# Patient Record
Sex: Female | Born: 1990 | Race: Black or African American | Hispanic: No | Marital: Single | Smoking: Never smoker
Health system: Southern US, Community
[De-identification: ages and names within clinical notes are randomized; demographics above are authoritative.]

---
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
## 2010-02-05 ENCOUNTER — Emergency Department (HOSPITAL_COMMUNITY): Admission: EM | Admit: 2010-02-05 | Discharge: 2010-02-05 | Payer: Self-pay | Admitting: Emergency Medicine

## 2010-09-12 ENCOUNTER — Ambulatory Visit (HOSPITAL_COMMUNITY): Admission: RE | Admit: 2010-09-12 | Payer: Self-pay | Source: Home / Self Care | Admitting: Obstetrics & Gynecology

## 2010-10-02 ENCOUNTER — Ambulatory Visit (HOSPITAL_COMMUNITY)
Admission: RE | Admit: 2010-10-02 | Discharge: 2010-10-02 | Payer: Self-pay | Source: Home / Self Care | Attending: Obstetrics & Gynecology | Admitting: Obstetrics & Gynecology

## 2010-10-08 ENCOUNTER — Encounter: Payer: Self-pay | Admitting: Obstetrics & Gynecology

## 2011-03-02 ENCOUNTER — Other Ambulatory Visit: Payer: Self-pay | Admitting: Obstetrics & Gynecology

## 2011-03-02 DIAGNOSIS — O480 Post-term pregnancy: Secondary | ICD-10-CM

## 2011-03-04 ENCOUNTER — Inpatient Hospital Stay (HOSPITAL_COMMUNITY)
Admission: AD | Admit: 2011-03-04 | Discharge: 2011-03-07 | DRG: 775 | Disposition: A | Discharge status: Home / Self Care | Payer: Medicaid Other | Source: Ambulatory Visit | Attending: Obstetrics & Gynecology | Admitting: Obstetrics & Gynecology

## 2011-03-05 LAB — CBC
Hemoglobin: 13.2 g/dL (ref 12.0–15.0)
RBC: 4.75 MIL/uL (ref 3.87–5.11)
WBC: 8.8 10*3/uL (ref 4.0–10.5)

## 2011-03-05 LAB — RPR: RPR Ser Ql: NONREACTIVE

## 2011-03-06 ENCOUNTER — Ambulatory Visit (HOSPITAL_COMMUNITY): Payer: Medicaid Other | Attending: Obstetrics & Gynecology

## 2011-03-06 LAB — CBC
HCT: 31.5 % — ABNORMAL LOW (ref 36.0–46.0)
Hemoglobin: 10.5 g/dL — ABNORMAL LOW (ref 12.0–15.0)
RBC: 3.8 MIL/uL — ABNORMAL LOW (ref 3.87–5.11)
WBC: 11 10*3/uL — ABNORMAL HIGH (ref 4.0–10.5)

## 2011-03-13 NOTE — H&P (Signed)
  NAMEMONIK, LINS NO.:  [id number]  MEDICAL RECORD NO.:  [id number]  LOCATION:  9118                          FACILITY:  [facility]  PHYSICIAN:  Roseanna Rainbow, M.D.DATE OF BIRTH:  [birth year]  DATE OF ADMISSION:  03/04/2011 DATE OF DISCHARGE:                             HISTORY & PHYSICAL   CHIEF COMPLAINT:  The patient is a 20 year old gravida 1, para 0 with an estimated date of confinement of March 02, 2011, with an intrauterine pregnancy of 40 plus weeks complaining of contractions.  HISTORY OF PRESENT ILLNESS:  Please see the above.  The patient denies rupture of membranes.  ALLERGIES:  No known drug allergies.  MEDICATIONS:  Please see the medication reconciliation form.  PRENATAL LABORATORY DATA:  Chlamydia probe negative.  Urine culture and sensitivity no growth.  Pap smear negative.  GC probe negative.  A 2- hour GTT normal.  Hepatitis B surface antigen negative.  Hematocrit33.5, hemoglobin 11.2.  HIV nonreactive.  Quad screen positive, increases risk of Down's, however, the patient's final EDC was re-dated based on ultrasound dating.  Platelet count 224,000, blood type is B+, antibody screen negative, RPR nonreactive, rubella immune, sickle cell negative.  PAST GYNECOLOGICAL HISTORY:  Noncontributory.  PAST MEDICAL HISTORY:  She denies past surgical history.  She denies social history.  She works at [employer].  She is single, not currently using alcohol.  Denies any tobacco or drug use.  FAMILY HISTORY:  Remarkable for diabetes, hypertension.  PHYSICAL EXAMINATION:  VITAL SIGNS:  Stable, afebrile.  Fetal heart tracing 140s, moderate long-term variability.  Contractions every 5 minutes. PELVIC:  Sterile vaginal exam per the RN, cervix is 5 cm, dilated, 80% effaced.  ASSESSMENT:  Nullipara at term, early active labor versus late latent labor.  Category I fetal heart tracing.  PLAN:  Admission, expectant management, anticipate a vaginal  delivery.     Roseanna Rainbow, M.D.     Judee Clara  D:  03/05/2011  T:  03/05/2011  Job:  [id number]  Electronically Signed by Antionette Char M.D. on 03/13/2011 03:46:20 PM

## 2011-06-24 IMAGING — US US OB DETAIL+14 WK
1 series · 12 of 28 positions shown · non-contrast
Comparison: none

[Series 1: us ob detail +14 wk · 12 of 78 slices shown]
[im 3/78]
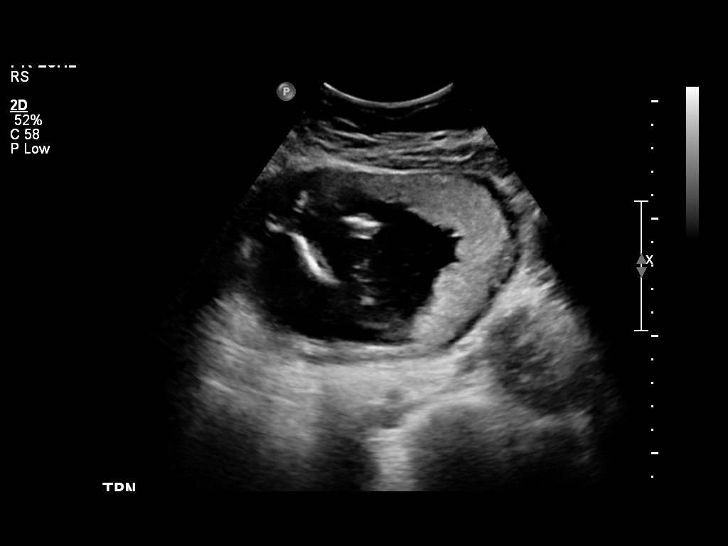
[im 9/78]
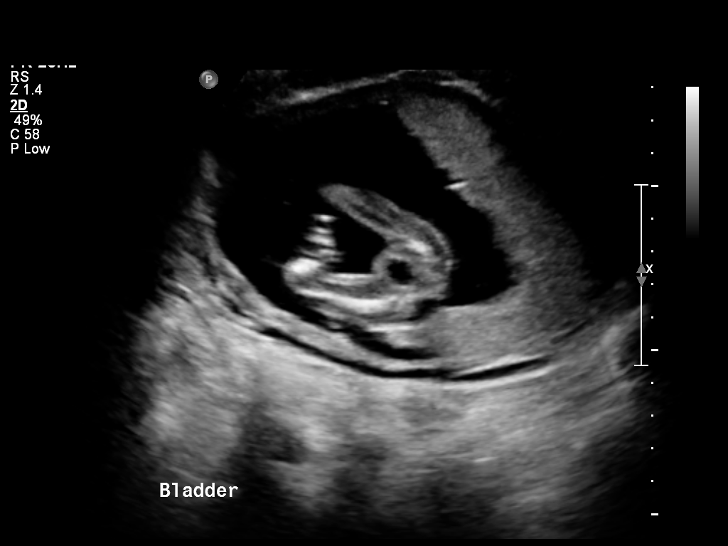
[im 15/78]
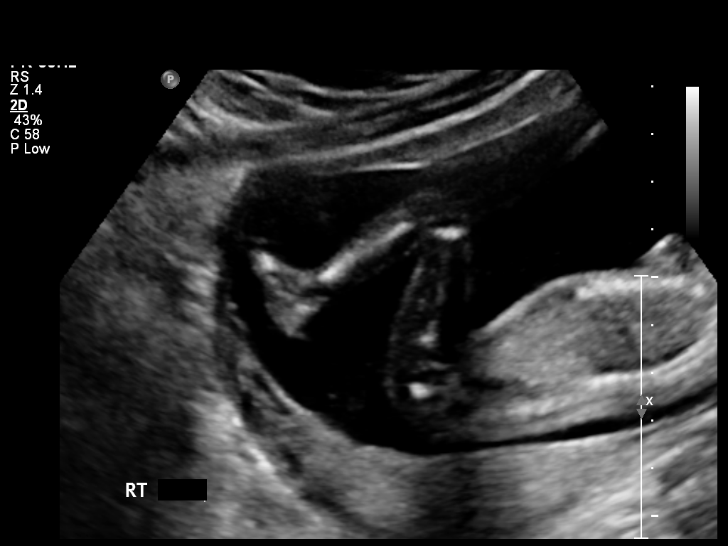
[im 23/78]
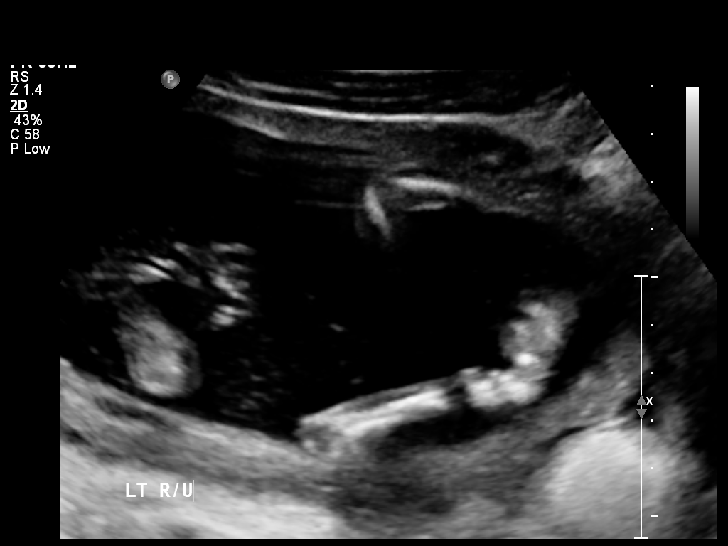
[im 29/78]
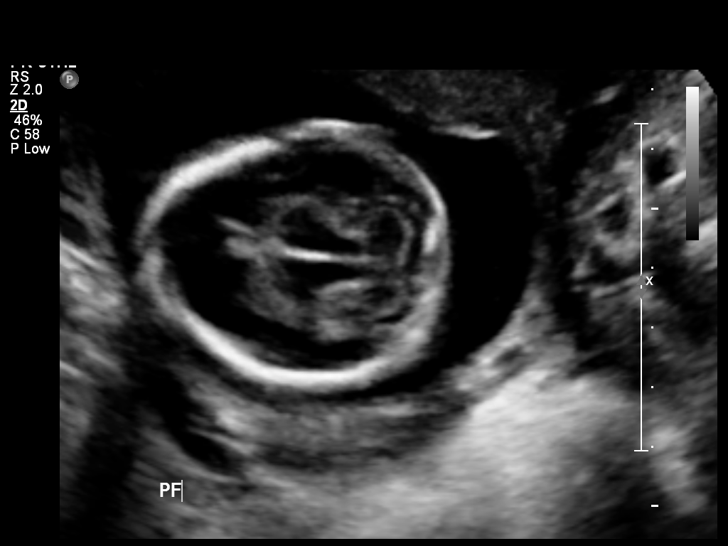
[im 35/78]
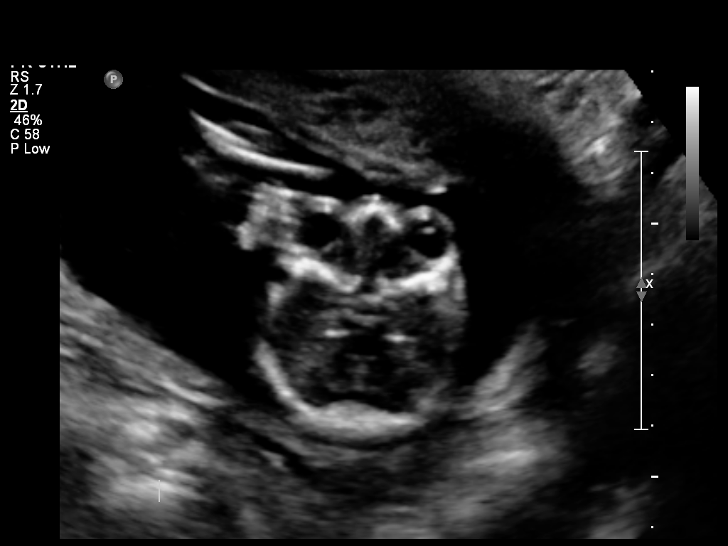
[im 43/78]
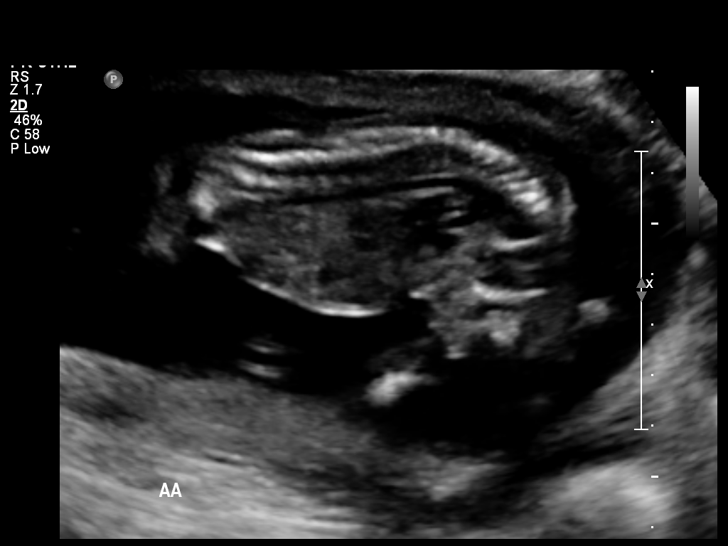
[im 49/78]
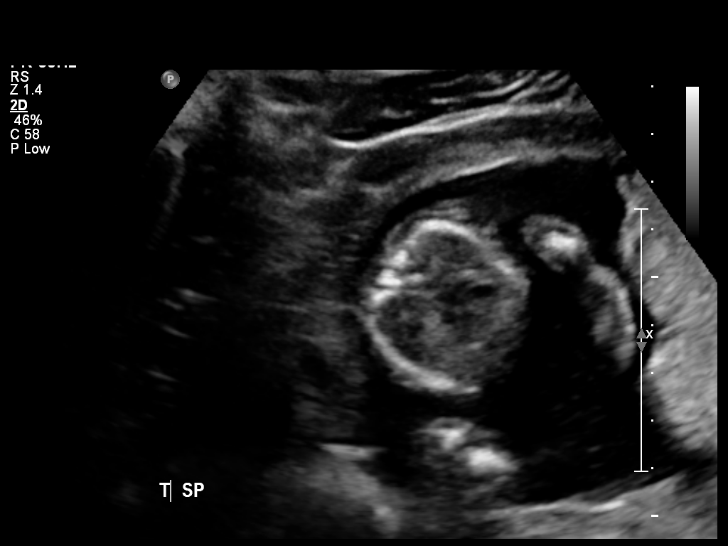
[im 55/78]
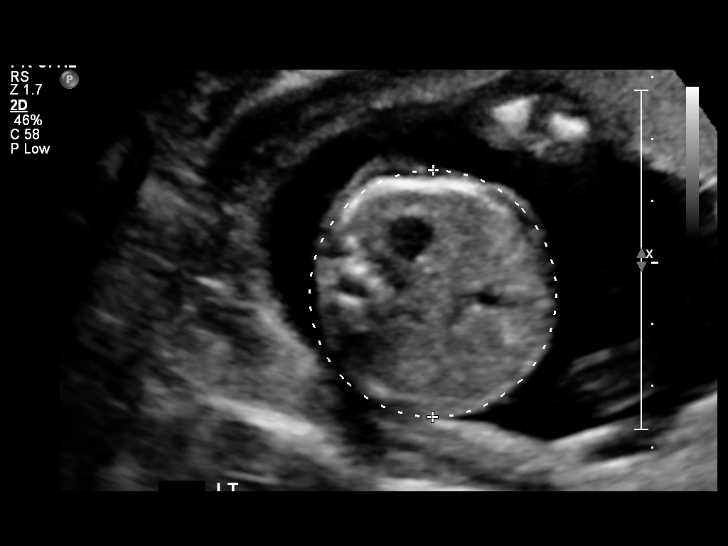
[im 63/78]
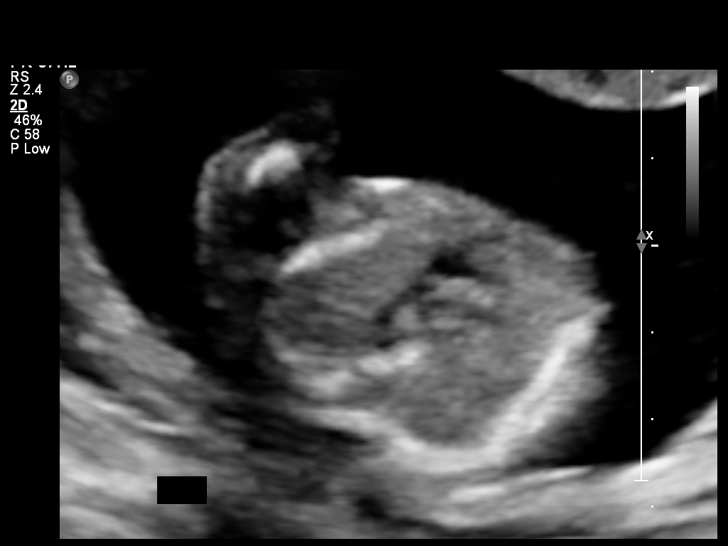
[im 69/78]
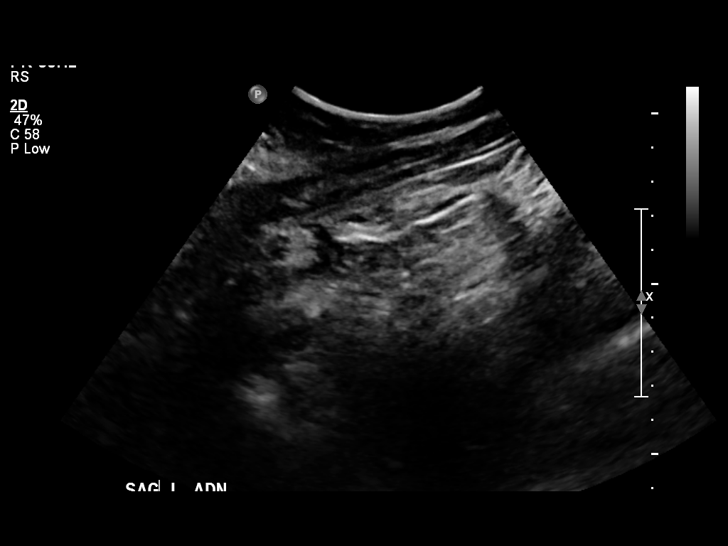
[im 75/78]
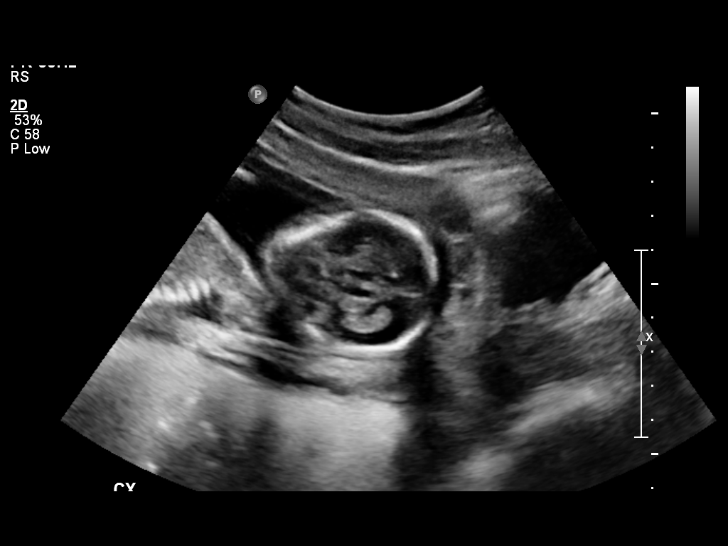

[12 of 28 positions shown; findings below may reference images not displayed]

OBSTETRICS REPORT
                      (Signed Final 10/02/2010 [DATE])

 Order#:         _O
Procedures

 US OB DETAIL +14 WK                                   76811.0
Indications

 Unsure of LMP;  Establish Gestational [AGE]
 Detailed fetal anatomic survey
Fetal Evaluation

 Fetal Heart Rate:  139                          bpm
 Cardiac Activity:  Observed
 Presentation:      Cephalic
 Placenta:          Left lateral, above cervical
                    os
 P. Cord            Visualized
 Insertion:

 Amniotic Fluid
 AFI FV:      Subjectively within normal limits
                                             Larg Pckt:     3.6  cm
Biometry

 BPD:       41  mm     G. Age:  18w 3d                CI:        70.87   70 - 86
                                                      FL/HC:      18.2   15.8 -
                                                                         18
 HC:     155.2  mm     G. Age:  18w 3d       44  %    HC/AC:      1.25   1.07 -

 AC:     123.7  mm     G. Age:  18w 0d       33  %    FL/BPD:
 FL:      28.2  mm     G. Age:  18w 5d       52  %    FL/AC:      22.8   20 - 24
 HUM:     27.4  mm     G. Age:  18w 5d       62  %
 NFT:      2.8  mm

 Est. FW:     235  gm      0 lb 8 oz     45  %
Gestational Age

 U/S Today:     18w 3d                                        EDD:   03/02/11
 Best:          18w 3d     Det. By:  U/S (10/02/10)           EDD:   03/02/11
Anatomy

 Cranium:           Appears normal      Aortic Arch:       Appears normal
 Fetal Cavum:       Appears normal      Ductal Arch:       Appears normal
 Ventricles:        Appears normal      Diaphragm:         Appears normal
 Choroid Plexus:    Appears normal      Stomach:           Appears normal
 Cerebellum:        Appears normal      Abdomen:           Appears normal
 Posterior Fossa:   Appears normal      Abdominal Wall:    Appears nml
                                                           (cord insert,
                                                           abd wall)
 Nuchal Fold:       Appears normal      Cord Vessels:      Appears normal
                    (neck, nuchal                          (3 vessel cord)
                    fold)
 Face:              Appears normal      Kidneys:           Appear normal
                    (lips/profile/orbit
                    s)
 Heart:             Appears normal      Bladder:           Appears normal
                    (4 chamber &
                    axis)
 RVOT:              Appears normal      Spine:             Appears normal
 LVOT:              Appears normal      Limbs:             Appears normal
                                                           (hands, ankles,
                                                           feet)

 Other:     Female gender. Heels and 5th digit visualized. Nasal
            bone visualized.
Cervix Uterus Adnexa

 Cervical Length:    3.23     cm

 Cervix:       Normal appearance by transabdominal scan.
 Left Ovary:    Within normal limits.
 Right Ovary:   Within normal limits.
 Adnexa:     No abnormality visualized.
Impression

 Siup demonstrating an EGA by ultrasound of 18w 3d. Fetal
 parameters correlate well with this composite EGA.

 No focal fetal or placental abnormalities are noted with a
 good anatomic evaluation possible. No soft markers for Down
 Syndrome are seen. Given the expected age at delivery of
 19, today's normal ultrasound would decrease the age related
 risk for Down Syndrome from [DATE] to [DATE] (Nayoon et al).
 Correlation with other aneuploidy screening results, if
 available, would be recommended for a more complete risk
 assessment.

 Subjectively and quantitatively normal amniotic fluid volume.
 Normal cervical length.

 questions or concerns.

## 2012-02-13 ENCOUNTER — Emergency Department (HOSPITAL_COMMUNITY)
Admission: EM | Admit: 2012-02-13 | Discharge: 2012-02-13 | Disposition: A | Discharge status: Home / Self Care | Payer: Self-pay | Attending: Emergency Medicine | Admitting: Emergency Medicine

## 2012-02-13 ENCOUNTER — Encounter (HOSPITAL_COMMUNITY): Payer: Self-pay | Admitting: *Deleted

## 2012-02-13 DIAGNOSIS — J020 Streptococcal pharyngitis: Secondary | ICD-10-CM | POA: Insufficient documentation

## 2012-02-13 LAB — RAPID STREP SCREEN (MED CTR MEBANE ONLY): Streptococcus, Group A Screen (Direct): POSITIVE — AB

## 2012-02-13 MED ORDER — DEXAMETHASONE SODIUM PHOSPHATE 10 MG/ML IJ SOLN
10.0000 mg | Freq: Once | INTRAMUSCULAR | Status: AC
  Administered 2012-02-13: 10 mg via INTRAMUSCULAR
  Filled 2012-02-13: qty 1

## 2012-02-13 MED ORDER — IBUPROFEN 800 MG PO TABS
800.0000 mg | ORAL_TABLET | Freq: Once | ORAL | Status: AC
  Administered 2012-02-13: 800 mg via ORAL
  Filled 2012-02-13: qty 1

## 2012-02-13 MED ORDER — PENICILLIN G BENZATHINE 1200000 UNIT/2ML IM SUSP
1.2000 10*6.[IU] | INTRAMUSCULAR | Status: AC
  Administered 2012-02-13: 1.2 10*6.[IU] via INTRAMUSCULAR
  Filled 2012-02-13: qty 2

## 2012-02-13 MED ORDER — HYDROCODONE-ACETAMINOPHEN 7.5-500 MG/15ML PO SOLN: 15.0000 mL | Freq: Four times a day (QID) | ORAL | Status: AC | PRN

## 2012-02-13 NOTE — ED Notes (Signed)
Pt states sore throat for the past 2 days. Pt throat red, pt states painful swallowing.

## 2012-02-13 NOTE — ED Provider Notes (Signed)
History     CSN: [id number]  Arrival date & time 02/13/12  2037   First MD Initiated Contact with Patient 02/13/12 2138      Chief Complaint  Patient presents with  . Sore Throat    (Consider location/radiation/quality/duration/timing/severity/associated sxs/prior treatment) HPI Comments: 2 days of sore throat difficulty swallowing. Subjective fevers at home. No chest pain, shortness of breath, cough, abdominal pain nausea or vomiting. No rash.  Patient is a 21 y.o. female presenting with pharyngitis. The history is provided by the patient.  Sore Throat This is a new problem. The current episode started 2 days ago. The problem occurs constantly. The problem has not changed since onset.Pertinent negatives include no chest pain, no abdominal pain, no headaches and no shortness of breath. The symptoms are aggravated by swallowing and eating. The symptoms are relieved by nothing.    History reviewed. No pertinent past medical history.  History reviewed. No pertinent past surgical history.  History reviewed. No pertinent family history.  History  Substance Use Topics  . Smoking status: Never Smoker   . Smokeless tobacco: Not on file  . Alcohol Use: No    OB History    Grav Para Term Preterm Abortions TAB SAB Ect Mult Living                  Review of Systems  Constitutional: Positive for fever.  HENT: Positive for sore throat, trouble swallowing and voice change. Negative for neck pain.   Respiratory: Negative for cough and shortness of breath.   Cardiovascular: Negative for chest pain.  Gastrointestinal: Negative for nausea, vomiting and abdominal pain.  Genitourinary: Negative for dysuria.  Musculoskeletal: Negative for back pain.  Neurological: Negative for headaches.    Allergies  Review of patient's allergies indicates no known allergies.  Home Medications   Current Outpatient Rx  Name Route Sig Dispense Refill  . HYDROCODONE-ACETAMINOPHEN 7.5-500 MG/15ML PO  SOLN Oral Take 15 mLs by mouth every 6 (six) hours as needed for pain. 120 mL 0    BP 118/74  Pulse 101  Temp(Src) 100.5 F (38.1 C) (Oral)  Resp 16  SpO2 100%  Physical Exam  Constitutional: She is oriented to person, place, and time. She appears well-developed and well-nourished. No distress.  HENT:  Head: Normocephalic and atraumatic.  Right Ear: External ear normal.  Left Ear: External ear normal.  Mouth/Throat: Oropharyngeal exudate present.       Bilateral tonsillar exudates, no asymmetry, no tongue elevation, no trismus  Eyes: Conjunctivae and EOM are normal. Pupils are equal, round, and reactive to light.  Neck: Normal range of motion. Neck supple.       No meningism  Cardiovascular: Normal rate, regular rhythm and normal heart sounds.   No murmur heard. Pulmonary/Chest: Effort normal and breath sounds normal. No respiratory distress.  Abdominal: Soft. There is no tenderness. There is no rebound and no guarding.  Musculoskeletal: Normal range of motion. She exhibits no edema and no tenderness.  Lymphadenopathy:    She has cervical adenopathy.  Neurological: She is alert and oriented to person, place, and time. No cranial nerve deficit.  Skin: Skin is warm.    ED Course  Procedures (including critical care time)  Labs Reviewed  RAPID STREP SCREEN - Abnormal; Notable for the following:    Streptococcus, Group A Screen (Direct) POSITIVE (*)    All other components within normal limits   No results found.   1. Strep pharyngitis  MDM  Sore throat with exudates, fever, lymphadenopathy, abscess or cough. We'll treat based on centor criteria. Decadron for swelling, by mouth challenge  Tolerating PO.  Given lortab elixir, decadron. Return precautions discussed.    Glynn Octave, MD 02/14/12 [phone]

## 2012-02-13 NOTE — Discharge Instructions (Signed)
Strep Throat Followup with her Dr. this week. Return to ED if you develop fever, worsening pain, difficulty swallowing, difficulty breathing or any other concerns. Strep throat is an infection of the throat caused by a bacteria named Streptococcus pyogenes. Your caregiver may call the infection streptococcal "tonsillitis" or "pharyngitis" depending on whether there are signs of inflammation in the tonsils or back of the throat. Strep throat is most common in children from 28 to 62 years old during the cold months of the year, but it can occur in people of any age during any season. This infection is spread from person to person (contagious) through coughing, sneezing, or other close contact. SYMPTOMS   Fever or chills.   Painful, swollen, red tonsils or throat.   Pain or difficulty when swallowing.   White or yellow spots on the tonsils or throat.   Swollen, tender lymph nodes or "glands" of the neck or under the jaw.   Red rash all over the body (rare).  DIAGNOSIS  Many different infections can cause the same symptoms. A test must be done to confirm the diagnosis so the right treatment can be given. A "rapid strep test" can help your caregiver make the diagnosis in a few minutes. If this test is not available, a light swab of the infected area can be used for a throat culture test. If a throat culture test is done, results are usually available in a day or two. TREATMENT  Strep throat is treated with antibiotic medicine. HOME CARE INSTRUCTIONS   Gargle with 1 tsp of salt in 1 cup of warm water, 3 to 4 times per day or as needed for comfort.   Family members who also have a sore throat or fever should be tested for strep throat and treated with antibiotics if they have the strep infection.   Make sure everyone in your household washes their hands well.   Do not share food, drinking cups, or personal items that could cause the infection to spread to others.   You may need to eat a soft  food diet until your sore throat gets better.   Drink enough water and fluids to keep your urine clear or pale yellow. This will help prevent dehydration.   Get plenty of rest.   Stay home from school, daycare, or work until you have been on antibiotics for 24 hours.   Only take over-the-counter or prescription medicines for pain, discomfort, or fever as directed by your caregiver.   If antibiotics are prescribed, take them as directed. Finish them even if you start to feel better.  SEEK MEDICAL CARE IF:   The glands in your neck continue to enlarge.   You develop a rash, cough, or earache.   You cough up green, yellow-brown, or bloody sputum.   You have pain or discomfort not controlled by medicines.   Your problems seem to be getting worse rather than better.  SEEK IMMEDIATE MEDICAL CARE IF:   You develop any new symptoms such as vomiting, severe headache, stiff or painful neck, chest pain, shortness of breath, or trouble swallowing.   You develop severe throat pain, drooling, or changes in your voice.   You develop swelling of the neck, or the skin on the neck becomes red and tender.   You have a fever.   You develop signs of dehydration, such as fatigue, dry mouth, and decreased urination.   You become increasingly sleepy, or you cannot wake up completely.  Document Released: 08/21/2000  Document Revised: 08/13/2011 Document Reviewed: 10/23/2010 [facility] Patient Information 2012 [place], [place].

## 2013-12-28 ENCOUNTER — Ambulatory Visit (INDEPENDENT_AMBULATORY_CARE_PROVIDER_SITE_OTHER): Payer: 59 | Admitting: Physician Assistant

## 2013-12-28 VITALS — BP 110/70 | HR 94 | Temp 99.6°F | Resp 18 | Ht 68.0 in | Wt 217.0 lb

## 2013-12-28 DIAGNOSIS — J029 Acute pharyngitis, unspecified: Secondary | ICD-10-CM

## 2013-12-28 DIAGNOSIS — J36 Peritonsillar abscess: Secondary | ICD-10-CM

## 2013-12-28 LAB — POCT CBC
Granulocyte percent: 70.2 %G (ref 37–80)
HCT, POC: 39.6 % (ref 37.7–47.9)
Hemoglobin: 12.4 g/dL (ref 12.2–16.2)
Lymph, poc: 2.5 (ref 0.6–3.4)
MCH, POC: 26.1 pg — AB (ref 27–31.2)
MCHC: 31.3 g/dL — AB (ref 31.8–35.4)
MCV: 83.1 fL (ref 80–97)
MID (cbc): 0.8 (ref 0–0.9)
MPV: 7.9 fL (ref 0–99.8)
POC Granulocyte: 7.7 — AB (ref 2–6.9)
POC LYMPH PERCENT: 22.8 %L (ref 10–50)
POC MID %: 7 %M (ref 0–12)
Platelet Count, POC: 372 10*3/uL (ref 142–424)
RBC: 4.76 M/uL (ref 4.04–5.48)
RDW, POC: 14.9 %
WBC: 10.9 10*3/uL — AB (ref 4.6–10.2)

## 2013-12-28 LAB — POCT RAPID STREP A (OFFICE): Rapid Strep A Screen: NEGATIVE

## 2013-12-28 NOTE — Progress Notes (Signed)
Subjective:    Patient ID: Colleen Graham, female    DOB: 1991/03/17, 23 y.o.   MRN: [id number]  HPI 23 year old female presents for evaluation of sore throat x 2 days. States it has been progressively getting worse and now she can barely swallow. Admits she due to inability to swallow she is spitting her saliva out and can not swallow water well.  Pain is on the right side of her throat only.  She has had chills but no documented fever.  Hx of frequent strep infections as a child and also has had a left sided PTA "years" ago.  She has tried some lozenges but that has not helped much.   Patient is otherwise doing well with no other concerns today.  She works at a [employer].     Review of Systems  Constitutional: Positive for chills. Negative for fever.  HENT: Positive for sore throat, trouble swallowing and voice change. Negative for congestion and ear pain.   Respiratory: Negative for cough.   Gastrointestinal: Negative for nausea, vomiting and abdominal pain.  Neurological: Negative for dizziness and headaches.       Objective:   Physical Exam  Constitutional: She is oriented to person, place, and time. She appears well-developed and well-nourished.  HENT:  Head: Normocephalic and atraumatic.  Right Ear: Hearing, tympanic membrane, external ear and ear canal normal.  Left Ear: Hearing, tympanic membrane, external ear and ear canal normal.  Mouth/Throat: Mucous membranes are normal. Oropharyngeal exudate, posterior oropharyngeal edema, posterior oropharyngeal erythema and tonsillar abscesses present.  Uvula deviated to left, barely visible.   Eyes: Conjunctivae are normal.  Neck: Normal range of motion.  Cardiovascular: Normal rate, regular rhythm and normal heart sounds.   Pulmonary/Chest: Effort normal and breath sounds normal.  Lymphadenopathy:    She has cervical adenopathy.  Neurological: She is alert and oriented to person, place, and time.  Psychiatric: She has  a normal mood and affect. Her behavior is normal. Judgment and thought content normal.     Results for orders placed in visit on 12/28/13  POCT CBC      Result Value Ref Range   WBC 10.9 (*) 4.6 - 10.2 K/uL   Lymph, poc 2.5  0.6 - 3.4   POC LYMPH PERCENT 22.8  10 - 50 %L   MID (cbc) 0.8  0 - 0.9   POC MID % 7.0  0 - 12 %M   POC Granulocyte 7.7 (*) 2 - 6.9   Granulocyte percent 70.2  37 - 80 %G   RBC 4.76  4.04 - 5.48 M/uL   Hemoglobin 12.4  12.2 - 16.2 g/dL   HCT, POC 78.239.6  95.637.7 - 47.9 %   MCV 83.1  80 - 97 fL   MCH, POC 26.1 (*) 27 - 31.2 pg   MCHC 31.3 (*) 31.8 - 35.4 g/dL   RDW, POC 21.314.9     Platelet Count, POC 372  142 - 424 K/uL   MPV 7.9  0 - 99.8 fL  POCT RAPID STREP A (OFFICE)      Result Value Ref Range   Rapid Strep A Screen Negative  Negative         Assessment & Plan:  Acute pharyngitis - Plan: POCT CBC, POCT rapid strep A, Culture, Group A Strep  Peritonsillar abscess - Plan: POCT CBC, POCT rapid strep A  Patient is having difficulty swallowing and managing secretions - will send to ENT for further  evaluation and management.   [facility]. She is to f/u here only as needed.

## 2013-12-28 NOTE — Patient Instructions (Signed)
[facility] Pollyann Kennedyosen [address]. Ph# [phone] they are located 2nd floor of [facility] [employer] [facility].

## 2013-12-30 LAB — CULTURE, GROUP A STREP: ORGANISM ID, BACTERIA: NORMAL

## 2015-09-08 NOTE — L&D Delivery Note (Signed)
25 y.o. G2P1 at 6714w0d delivered a viable female infant in cephalic, LOA position. no nuchal cord, easily reduced. right anterior shoulder delivered with ease. 60 sec delayed cord clamping. Cord clamped x2 and cut. Placenta delivered spontaneously intact, with 3VC. Fundus firm on exam with massage and pitocin. Good hemostasis noted. True knot x 1  Laceration: 1st degree perineal Suture: 3-0 Vicryl EBL: 200 Good hemostasis noted.  Mom and baby recovering in LDR.    Apgars: 8,9 Weight: pending, skin to skin, see delivery summary  Cord blood obtained: Yes   WALLACE, NOAH I, DO PGY-3 08/17/2016, 8:26 AM   OB FELLOW DELIVERY ATTESTATION  I was gloved and present for the delivery in its entirety, and I agree with the above resident's note.    Ernestina PennaNicholas Sammie Denner, MD 9:21 AM

## 2016-01-08 ENCOUNTER — Ambulatory Visit: Payer: Self-pay

## 2016-01-08 DIAGNOSIS — N926 Irregular menstruation, unspecified: Secondary | ICD-10-CM

## 2016-01-08 LAB — POCT URINE PREGNANCY: Preg Test, Ur: POSITIVE — AB

## 2016-01-17 ENCOUNTER — Encounter: Payer: Self-pay | Admitting: Certified Nurse Midwife

## 2016-01-23 ENCOUNTER — Encounter: Payer: Self-pay | Admitting: Certified Nurse Midwife

## 2016-01-23 ENCOUNTER — Ambulatory Visit (INDEPENDENT_AMBULATORY_CARE_PROVIDER_SITE_OTHER): Payer: 59 | Admitting: Certified Nurse Midwife

## 2016-01-23 VITALS — BP 117/69 | HR 85 | Temp 98.7°F | Wt 187.0 lb

## 2016-01-23 DIAGNOSIS — O219 Vomiting of pregnancy, unspecified: Secondary | ICD-10-CM | POA: Diagnosis not present

## 2016-01-23 DIAGNOSIS — Z3481 Encounter for supervision of other normal pregnancy, first trimester: Secondary | ICD-10-CM | POA: Diagnosis not present

## 2016-01-23 DIAGNOSIS — Z3687 Encounter for antenatal screening for uncertain dates: Secondary | ICD-10-CM

## 2016-01-23 LAB — POCT URINALYSIS DIPSTICK
Bilirubin, UA: NEGATIVE
Glucose, UA: NEGATIVE
LEUKOCYTES UA: NEGATIVE
NITRITE UA: NEGATIVE
PH UA: 6
PROTEIN UA: NEGATIVE
Spec Grav, UA: 1.02
UROBILINOGEN UA: NEGATIVE

## 2016-01-23 MED ORDER — VITAFOL GUMMIES 3.33-0.333-34.8 MG PO CHEW: 3.0000 | CHEWABLE_TABLET | Freq: Every day | ORAL | Status: DC

## 2016-01-23 MED ORDER — DOXYLAMINE-PYRIDOXINE 10-10 MG PO TBEC: DELAYED_RELEASE_TABLET | ORAL | Status: DC

## 2016-01-23 NOTE — Progress Notes (Signed)
Subjective:    Colleen Graham is being seen today for her first obstetrical visit.  This is not a planned pregnancy. She is at [redacted]w[redacted]d. 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 Relationship with FOB: significant other, living together. Patient does intend to breast feed, breast fed last child. Pregnancy history fully reviewed.  Works full time at [employer].    The information documented in the HPI was reviewed and verified.  Menstrual History: OB History    Gravida Para Term Preterm AB TAB SAB Ectopic Multiple Living   2 1        1       Menarche age: 25 years of age.    Patient's last menstrual period was 11/04/2015 (lmp unknown).    No past medical history on file.  No past surgical history on file.   (Not in a hospital admission) No Known Allergies  Social History  Substance Use Topics  . Smoking status: Never Smoker   . Smokeless tobacco: Not on file  . Alcohol Use: No    Family History  Problem Relation Age of Onset  . Diabetes Maternal Grandfather      Review of Systems Constitutional: negative for weight loss Gastrointestinal: + for nausea & vomiting Genitourinary:negative for genital lesions and vaginal discharge and dysuria Musculoskeletal:negative for back pain Behavioral/Psych: negative for abusive relationship, depression, illegal drug usage and tobacco use    Objective:    BP 117/69 mmHg  Pulse 85  Temp(Src) 98.7 F (37.1 C)  Wt 187 lb (84.823 kg)  LMP 11/04/2015 (LMP Unknown) General Appearance:    Alert, cooperative, no distress, appears stated age  Head:    Normocephalic, without obvious abnormality, atraumatic  Eyes:    PERRL, conjunctiva/corneas clear, EOM's intact, fundi    benign, both eyes  Ears:    Normal TM's and external ear canals, both ears  Nose:   Nares normal, septum midline, mucosa normal, no drainage    or sinus tenderness  Throat:   Lips, mucosa, and tongue normal; teeth and gums normal  Neck:   Supple, symmetrical,  trachea midline, no adenopathy;    thyroid:  no enlargement/tenderness/nodules; no carotid   bruit or JVD  Back:     Symmetric, no curvature, ROM normal, no CVA tenderness  Lungs:     Clear to auscultation bilaterally, respirations unlabored  Chest Wall:    No tenderness or deformity   Heart:    Regular rate and rhythm, S1 and S2 normal, no murmur, rub   or gallop  Breast Exam:    No tenderness, masses, or nipple abnormality  Abdomen:     Soft, non-tender, bowel sounds active all four quadrants,    no masses, no organomegaly  Genitalia:    Normal female without lesion, discharge or tenderness  Extremities:   Extremities normal, atraumatic, no cyanosis or edema  Pulses:   2+ and symmetric all extremities  Skin:   Skin color, texture, turgor normal, no rashes or lesions  Lymph nodes:   Cervical, supraclavicular, and axillary nodes normal  Neurologic:   CNII-XII intact, normal strength, sensation and reflexes    throughout            Cervix:  Long,thick, closed and posterior.    FHR: by bedside US, 150's by doppler, FH around 14 weeks  Lab Review Urine pregnancy test Labs reviewed yes Radiologic studies reviewed no Assessment:    Pregnancy at 11 weeks   Unsure of LMP  nausea in early  pregnancy  Plan:  Prenatal vitamins.  Counseling provided regarding continued use of seat belts, cessation of alcohol consumption, smoking or use of illicit drugs; infection precautions i.e., influenza/TDAP immunizations, toxoplasmosis,CMV, parvovirus, listeria and varicella; workplace safety, exercise during pregnancy; routine dental care, safe medications, sexual activity, hot tubs, saunas, pools, travel, caffeine use, fish and methlymercury, potential toxins, hair treatments, varicose veins Weight gain recommendations per IOM guidelines reviewed: underweight/BMI< 18.5--> gain 28 - 40 lbs; normal weight/BMI 18.5 - 24.9--> gain 25 - 35 lbs; overweight/BMI 25 - 29.9--> gain 15 - 25 lbs; obese/BMI >30->gain   11 - 20 lbs Problem list reviewed and updated. FIRST/CF mutation testing/NIPT/QUAD SCREEN/fragile X/Ashkenazi Jewish population testing/Spinal muscular atrophy discussed: ordered. Role of ultrasound in pregnancy discussed; fetal survey: requested. Amniocentesis discussed: not indicated. VBAC calculator score: VBAC consent form provided Meds ordered this encounter  Medications  . Doxylamine-Pyridoxine (DICLEGIS) 10-10 MG TBEC    Sig: Take 1 tablet with breakfast and lunch.  Take 2 tablets at bedtime.    Dispense:  100 tablet    Refill:  4  . Prenatal Vit-Fe Phos-FA-Omega (VITAFOL GUMMIES) 3.33-0.333-34.8 MG CHEW    Sig: Chew 3 tablets by mouth daily.    Dispense:  90 tablet    Refill:  12   Orders Placed This Encounter  Procedures  . Culture, OB Urine  . Result  . US OB Comp Less 14 Wks    Standing Status: Future     Number of Occurrences:      Standing Expiration Date: 03/24/2017    Order Specific Question:  Reason for Exam (SYMPTOM  OR DIAGNOSIS REQUIRED)    Answer:  dating    Order Specific Question:  Preferred imaging location?    Answer:  Internal  . TSH  . HIV antibody  . Hemoglobinopathy evaluation  . Varicella zoster antibody, IgG  . Prenatal Profile I  . MaterniT21 PLUS Core+SCA    Order Specific Question:  Is the patient insulin dependent?    Answer:  No    Order Specific Question:  Please enter gestational age. This should be expressed as weeks AND days, i.e. 16w 6d. Enter weeks here. Enter days in next question.    Answer:  68    Order Specific Question:  Please enter gestational age. This should be expressed as weeks AND days, i.e. 16w 6d. Enter days here. Enter weeks in previous question.    Answer:  3    Order Specific Question:  How was gestational age calculated?    Answer:  LMP    Order Specific Question:  Please give the date of LMP OR Ultrasound OR Estimated date of delivery.    Answer:  08/10/2016    Order Specific Question:  Number of Fetuses (Type of  Pregnancy):    Answer:  1    Order Specific Question:  Indications for performing the test? (please choose all that apply):    Answer:  Routine screening    Order Specific Question:  Other Indications? (Y=Yes, N=No)    Answer:  N    Order Specific Question:  If this is a repeat specimen, please indicate the reason:    Answer:  Not indicated    Order Specific Question:  Please specify the patient's race: (C=White/Caucasion, B=Black, I=Native American, A=Asian, H=Hispanic, O=Other, U=Unknown)    Answer:  B    Order Specific Question:  Donor Egg - indicate if the egg was obtained from in vitro fertilization.    Answer:  N  Order Specific Question:  Age of Egg Donor.    Answer:  [age]    Order Specific Question:  Prior Down Syndrome/ONTD screening during current pregnancy.    Answer:  N    Order Specific Question:  Prior First Trimester Testing    Answer:  N    Order Specific Question:  Prior Second Trimester Testing    Answer:  N    Order Specific Question:  Family History of Neural Tube Defects    Answer:  N    Order Specific Question:  Prior Pregnancy with Down Syndrome    Answer:  N    Order Specific Question:  Please give the patient's weight (in pounds)    Answer:  187  . POCT urinalysis dipstick    Follow up in 4 weeks. 50% of 30 min visit spent on counseling and coordination of care.

## 2016-01-25 LAB — URINE CULTURE, OB REFLEX

## 2016-01-25 LAB — CULTURE, OB URINE

## 2016-01-26 DIAGNOSIS — Z3490 Encounter for supervision of normal pregnancy, unspecified, unspecified trimester: Secondary | ICD-10-CM | POA: Insufficient documentation

## 2016-01-27 LAB — NUSWAB VG+, CANDIDA 6SP
CANDIDA PARAPSILOSIS, NAA: NEGATIVE
CHLAMYDIA TRACHOMATIS, NAA: NEGATIVE
Candida albicans, NAA: NEGATIVE
Candida glabrata, NAA: NEGATIVE
Candida krusei, NAA: NEGATIVE
Candida lusitaniae, NAA: NEGATIVE
Candida tropicalis, NAA: NEGATIVE
Neisseria gonorrhoeae, NAA: NEGATIVE
TRICH VAG BY NAA: NEGATIVE

## 2016-01-27 LAB — PAP IG W/ RFLX HPV ASCU: PAP Smear Comment: 0

## 2016-01-28 ENCOUNTER — Ambulatory Visit (INDEPENDENT_AMBULATORY_CARE_PROVIDER_SITE_OTHER): Payer: 59

## 2016-01-28 ENCOUNTER — Other Ambulatory Visit: Payer: Self-pay

## 2016-01-28 DIAGNOSIS — Z3481 Encounter for supervision of other normal pregnancy, first trimester: Secondary | ICD-10-CM

## 2016-01-28 DIAGNOSIS — O3680X1 Pregnancy with inconclusive fetal viability, fetus 1: Secondary | ICD-10-CM

## 2016-01-28 DIAGNOSIS — Z3687 Encounter for antenatal screening for uncertain dates: Secondary | ICD-10-CM

## 2016-01-28 DIAGNOSIS — O219 Vomiting of pregnancy, unspecified: Secondary | ICD-10-CM

## 2016-02-02 LAB — PRENATAL PROFILE I(LABCORP)
Antibody Screen: NEGATIVE
BASOS: 0 %
Basophils Absolute: 0 10*3/uL (ref 0.0–0.2)
EOS (ABSOLUTE): 0.1 10*3/uL (ref 0.0–0.4)
Eos: 1 %
HEMATOCRIT: 34.3 % (ref 34.0–46.6)
HEP B S AG: NEGATIVE
Hemoglobin: 11.4 g/dL (ref 11.1–15.9)
IMMATURE GRANS (ABS): 0 10*3/uL (ref 0.0–0.1)
Immature Granulocytes: 0 %
LYMPHS: 19 %
Lymphocytes Absolute: 2 10*3/uL (ref 0.7–3.1)
MCH: 28.7 pg (ref 26.6–33.0)
MCHC: 33.2 g/dL (ref 31.5–35.7)
MCV: 86 fL (ref 79–97)
MONOCYTES: 6 %
Monocytes Absolute: 0.6 10*3/uL (ref 0.1–0.9)
Neutrophils Absolute: 7.5 10*3/uL — ABNORMAL HIGH (ref 1.4–7.0)
Neutrophils: 74 %
Platelets: 276 10*3/uL (ref 150–379)
RBC: 3.97 x10E6/uL (ref 3.77–5.28)
RDW: 14.7 % (ref 12.3–15.4)
RPR: NONREACTIVE
Rh Factor: POSITIVE
Rubella Antibodies, IGG: 2.49 index (ref >0.99)
WBC: 10.2 10*3/uL (ref 3.4–10.8)

## 2016-02-02 LAB — MATERNIT21 PLUS CORE+SCA
CHROMOSOME 13: NEGATIVE
CHROMOSOME 18: NEGATIVE
CHROMOSOME 21: NEGATIVE
PDF: 0
Y CHROMOSOME: DETECTED

## 2016-02-02 LAB — TSH: TSH: 1.28 u[IU]/mL (ref 0.450–4.500)

## 2016-02-02 LAB — VARICELLA ZOSTER ANTIBODY, IGG: VARICELLA: 1340 {index} (ref >165)

## 2016-02-02 LAB — HEMOGLOBINOPATHY EVALUATION
HEMOGLOBIN A2 QUANTITATION: 2.9 % (ref 0.7–3.1)
HEMOGLOBIN F QUANTITATION: 0.6 % (ref 0.0–2.0)
HGB C: 0 %
HGB S: 0 %
Hgb A: 96.5 % (ref 94.0–98.0)

## 2016-02-02 LAB — HIV ANTIBODY (ROUTINE TESTING W REFLEX): HIV SCREEN 4TH GENERATION: NONREACTIVE

## 2016-02-03 ENCOUNTER — Other Ambulatory Visit: Payer: Self-pay | Admitting: Certified Nurse Midwife

## 2016-02-20 ENCOUNTER — Ambulatory Visit (INDEPENDENT_AMBULATORY_CARE_PROVIDER_SITE_OTHER): Payer: 59 | Admitting: Certified Nurse Midwife

## 2016-02-20 VITALS — BP 114/64 | HR 86 | Temp 98.8°F | Wt 193.0 lb

## 2016-02-20 DIAGNOSIS — Z3482 Encounter for supervision of other normal pregnancy, second trimester: Secondary | ICD-10-CM

## 2016-02-20 LAB — POCT URINALYSIS DIPSTICK
BILIRUBIN UA: NEGATIVE
Glucose, UA: NEGATIVE
KETONES UA: NEGATIVE
Nitrite, UA: NEGATIVE
PH UA: 7
Protein, UA: NEGATIVE
RBC UA: NEGATIVE
SPEC GRAV UA: 1.01
Urobilinogen, UA: NEGATIVE

## 2016-02-20 NOTE — Progress Notes (Signed)
  Subjective:    Colleen Graham is a 25 y.o. female being seen today for her obstetrical visit. She is at 6354w3d gestation. Patient reports: no complaints.  Problem List Items Addressed This Visit    None    Visit Diagnoses    Supervision of other normal pregnancy, antepartum, second trimester    -  Primary    Relevant Orders    US OB Comp + 14 Wk      Patient Active Problem List   Diagnosis Date Noted  . Encounter for supervision of other normal pregnancy in first trimester 01/26/2016    Objective:     BP 114/64 mmHg  Pulse 86  Temp(Src) 98.8 F (37.1 C)  Wt 193 lb (87.544 kg)  LMP 11/04/2015 (LMP Unknown) Uterine Size: Below umbilicus    Fetal HR: 134 Assessment:    Pregnancy @ 4354w3d  weeks Doing well    Plan:    Problem list reviewed and updated. Labs reviewed.  Follow up in 4 weeks. FIRST/CF mutation testing/NIPT/QUAD SCREEN/fragile X/Ashkenazi Jewish population testing/Spinal muscular atrophy discussed: results reviewed. Role of ultrasound in pregnancy discussed; fetal survey: ordered. Amniocentesis discussed: not indicated. 50% of 15 minute visit spent on counseling and coordination of care.

## 2016-02-24 NOTE — Progress Notes (Signed)
I agree with note by NP Student Andrew Brake.  Was present for exam.  R.Aliyanah Rozas CNM 

## 2016-03-19 ENCOUNTER — Ambulatory Visit (INDEPENDENT_AMBULATORY_CARE_PROVIDER_SITE_OTHER): Payer: 59 | Admitting: Certified Nurse Midwife

## 2016-03-19 ENCOUNTER — Ambulatory Visit (INDEPENDENT_AMBULATORY_CARE_PROVIDER_SITE_OTHER): Payer: 59

## 2016-03-19 VITALS — BP 124/67 | HR 103 | Temp 97.4°F | Wt 212.0 lb

## 2016-03-19 DIAGNOSIS — Z36 Encounter for antenatal screening of mother: Secondary | ICD-10-CM

## 2016-03-19 DIAGNOSIS — Z3492 Encounter for supervision of normal pregnancy, unspecified, second trimester: Secondary | ICD-10-CM

## 2016-03-19 DIAGNOSIS — Z3482 Encounter for supervision of other normal pregnancy, second trimester: Secondary | ICD-10-CM

## 2016-03-19 LAB — POCT URINALYSIS DIPSTICK
BILIRUBIN UA: NEGATIVE
Blood, UA: NEGATIVE
GLUCOSE UA: NEGATIVE
KETONES UA: NEGATIVE
LEUKOCYTES UA: NEGATIVE
NITRITE UA: NEGATIVE
PH UA: 8
Protein, UA: NEGATIVE
Spec Grav, UA: 1.015
Urobilinogen, UA: NEGATIVE

## 2016-03-19 NOTE — Progress Notes (Signed)
I agree with note by NP Student Andrew Brake.  Was present for exam.  R.Denney CNM 

## 2016-03-19 NOTE — Progress Notes (Signed)
Subjective:    Colleen Graham is a 25 y.o. female being seen today for her obstetrical visit. She is at 3845w3d gestation. Patient reports: no complaints . Fetal movement: normal.  Problem List Items Addressed This Visit    None    Visit Diagnoses    Prenatal care, second trimester    -  Primary    Relevant Orders    POCT urinalysis dipstick (Completed)      Patient Active Problem List   Diagnosis Date Noted  . Encounter for supervision of other normal pregnancy in first trimester 01/26/2016   Objective:    BP 124/67 mmHg  Pulse 103  Temp(Src) 97.4 F (36.3 C)  Wt 212 lb (96.163 kg)  LMP 11/04/2015 (LMP Unknown) FHT: 150 BPM  Uterine Size: size equals dates     Assessment:    Pregnancy @ 4645w3d    Plan:    OBGCT: discussed. Signs and symptoms of preterm labor: discussed. Smoking cessation discussed never smoked.  Labs, problem list reviewed and updated 2 hr GTT planned Follow up in 4 weeks.

## 2016-03-19 NOTE — Progress Notes (Signed)
Patient had US today- she does have a lot of breast pain.

## 2016-04-16 ENCOUNTER — Ambulatory Visit (INDEPENDENT_AMBULATORY_CARE_PROVIDER_SITE_OTHER): Payer: Managed Care, Other (non HMO) | Admitting: Certified Nurse Midwife

## 2016-04-16 VITALS — BP 115/77 | HR 87 | Wt 210.0 lb

## 2016-04-16 DIAGNOSIS — Z3482 Encounter for supervision of other normal pregnancy, second trimester: Secondary | ICD-10-CM

## 2016-04-16 NOTE — Progress Notes (Signed)
Subjective:    Anne Fushkelon Q Greth is a 25 y.o. female being seen today for her obstetrical visit. She is at 6658w3d gestation. Patient reports: no complaints . Fetal movement: normal.  Problem List Items Addressed This Visit    None    Visit Diagnoses   None.    Patient Active Problem List   Diagnosis Date Noted  . Encounter for supervision of other normal pregnancy in first trimester 01/26/2016   Objective:    BP 115/77   Pulse 87   Wt 210 lb (95.3 kg)   LMP 11/04/2015 (LMP Unknown)   BMI 31.93 kg/m  FHT: 152 BPM  Uterine Size: 24 cm and size equals dates     Assessment:    Pregnancy @ 4258w3d    Doing well  Plan:    OBGCT: discussed and ordered for next visit. Signs and symptoms of preterm labor: discussed.  Labs, problem list reviewed and updated 2 hr GTT planned Follow up in 4 weeks with 2 hour OGTT.

## 2016-04-16 NOTE — Patient Instructions (Addendum)
Before [facility] Before your baby arrives it is important to:  Have all of the supplies that you will need to care for your baby.  Know where to go if there is an emergency.  Discuss the baby's arrival with other family members. WHAT SUPPLIES WILL I NEED? It is recommended that you have the following supplies: Large Items  Crib.  Crib mattress.  Rear-facing infant car seat. If possible, have a trained professional check to make sure that it is installed correctly. Feeding  6-8 bottles that are 4-5 oz in size.  6-8 nipples.  Bottle brush.  Sterilizer, or a large pan or kettle with a lid.  A way to boil and cool water.  If you will be breastfeeding:  Breast pump.  Nipple cream.  Nursing bra.  Breast pads.  Breast shields.  If you will be formula feeding:  Formula.  Measuring cups.  Measuring spoons. Bathing  Mild baby soap and baby shampoo.  Petroleum jelly.  Soft cloth towel and washcloth.  Hooded towel.  Cotton balls.  Bath basin. Other Supplies  Rectal thermometer.  Bulb syringe.  Baby wipes or washcloths for diaper changes.  Diaper bag.  Changing pad.  Clothing, including one-piece outfits and pajamas.  Baby nail clippers.  Receiving blankets.  Mattress pad and sheets for the crib.  Night-light for the baby's room.  Baby monitor.  2 or 3 pacifiers.  Either 24-36 cloth diapers and waterproof diaper covers or a box of disposable diapers. You may need to use as many as 10-12 diapers per day. HOW DO I PREPARE FOR AN EMERGENCY? Prepare for an emergency by:  Knowing how to get to the nearest hospital.  Listing the phone numbers of your baby's health care providers near your home phone and in your cell phone. HOW DO I PREPARE MY FAMILY?  Decide how to handle visitors.  If you have other children:  Talk with them about the baby coming home. Ask them how they feel about it.  Read a book together about being a new big  brother or sister.  Find ways to let them help you prepare for the new baby.  Have someone ready to care for them while you are in the hospital.   This information is not intended to replace advice given to you by your health care provider. Make sure you discuss any questions you have with your health care provider.   Document Released: 08/06/2008 Document Revised: 01/08/2015 Document Reviewed: 08/01/2014 Elsevier Interactive Patient Education 2016 [employer].  Preterm Labor Information Preterm labor is when labor starts before you are [redacted] weeks pregnant. The normal length of pregnancy is 39 to 41 weeks.  CAUSES  The cause of preterm labor is not often known. The most common known cause is infection. RISK FACTORS  Having a history of preterm labor.  Having your water break before it should.  Having a placenta that covers the opening of the cervix.  Having a placenta that breaks away from the uterus.  Having a cervix that is too weak to hold the baby in the uterus.  Having too much fluid in the amniotic sac.  Taking drugs or smoking while pregnant.  Not gaining enough weight while pregnant.  Being younger than 30 and older than 27 years old.  Having a low income.  Being African American. SYMPTOMS  Period-like cramps, belly (abdominal) pain, or back pain.  Contractions that are regular, as often as six in an hour. They may be  mild or painful.  Contractions that start at the top of the belly. They then move to the lower belly and back.  Lower belly pressure that seems to get stronger.  Bleeding from the vagina.  Fluid leaking from the vagina. TREATMENT  Treatment depends on:  Your condition.  The condition of your baby.  How many weeks pregnant you are. Your doctor may have you:  Take medicine to stop contractions.  Stay in bed except to use the restroom (bed rest).  Stay in the hospital. WHAT SHOULD YOU DO IF YOU THINK YOU ARE IN PRETERM LABOR? Call  your doctor right away. You need to go to the hospital right away.  HOW CAN YOU PREVENT PRETERM LABOR IN FUTURE PREGNANCIES?  Stop smoking, if you smoke.  Maintain healthy weight gain.  Do not take drugs or be around chemicals that are not needed.  Tell your doctor if you think you have an infection.  Tell your doctor if you had a preterm labor before.   This information is not intended to replace advice given to you by your health care provider. Make sure you discuss any questions you have with your health care provider.   Document Released: 11/20/2008 Document Revised: 01/08/2015 Document Reviewed: 09/26/2012 Elsevier Interactive Patient Education 2016 [employer]. Second Trimester of Pregnancy The second trimester is from week 13 through week 28, months 4 through 6. The second trimester is often a time when you feel your best. Your body has also adjusted to being pregnant, and you begin to feel better physically. Usually, morning sickness has lessened or quit completely, you may have more energy, and you may have an increase in appetite. The second trimester is also a time when the fetus is growing rapidly. At the end of the sixth month, the fetus is about 9 inches long and weighs about 1 pounds. You will likely begin to feel the baby move (quickening) between 18 and 20 weeks of the pregnancy. BODY CHANGES Your body goes through many changes during pregnancy. The changes vary from woman to woman.   Your weight will continue to increase. You will notice your lower abdomen bulging out.  You may begin to get stretch marks on your hips, abdomen, and breasts.  You may develop headaches that can be relieved by medicines approved by your health care provider.  You may urinate more often because the fetus is pressing on your bladder.  You may develop or continue to have heartburn as a result of your pregnancy.  You may develop constipation because certain hormones are causing the  muscles that push waste through your intestines to slow down.  You may develop hemorrhoids or swollen, bulging veins (varicose veins).  You may have back pain because of the weight gain and pregnancy hormones relaxing your joints between the bones in your pelvis and as a result of a shift in weight and the muscles that support your balance.  Your breasts will continue to grow and be tender.  Your gums may bleed and may be sensitive to brushing and flossing.  Dark spots or blotches (chloasma, mask of pregnancy) may develop on your face. This will likely fade after the baby is born.  A dark line from your belly button to the pubic area (linea nigra) may appear. This will likely fade after the baby is born.  You may have changes in your hair. These can include thickening of your hair, rapid growth, and changes in texture. Some women also have hair  loss during or after pregnancy, or hair that feels dry or thin. Your hair will most likely return to normal after your baby is born. WHAT TO EXPECT AT YOUR PRENATAL VISITS During a routine prenatal visit:  You will be weighed to make sure you and the fetus are growing normally.  Your blood pressure will be taken.  Your abdomen will be measured to track your baby's growth.  The fetal heartbeat will be listened to.  Any test results from the previous visit will be discussed. Your health care provider may ask you:  How you are feeling.  If you are feeling the baby move.  If you have had any abnormal symptoms, such as leaking fluid, bleeding, severe headaches, or abdominal cramping.  If you are using any tobacco products, including cigarettes, chewing tobacco, and electronic cigarettes.  If you have any questions. Other tests that may be performed during your second trimester include:  Blood tests that check for:  Low iron levels (anemia).  Gestational diabetes (between 24 and 28 weeks).  Rh antibodies.  Urine tests to check for  infections, diabetes, or protein in the urine.  An ultrasound to confirm the proper growth and development of the baby.  An amniocentesis to check for possible genetic problems.  Fetal screens for spina bifida and Down syndrome.  HIV (human immunodeficiency virus) testing. Routine prenatal testing includes screening for HIV, unless you choose not to have this test. HOME CARE INSTRUCTIONS   Avoid all smoking, herbs, alcohol, and unprescribed drugs. These chemicals affect the formation and growth of the baby.  Do not use any tobacco products, including cigarettes, chewing tobacco, and electronic cigarettes. If you need help quitting, ask your health care provider. You may receive counseling support and other resources to help you quit.  Follow your health care provider's instructions regarding medicine use. There are medicines that are either safe or unsafe to take during pregnancy.  Exercise only as directed by your health care provider. Experiencing uterine cramps is a good sign to stop exercising.  Continue to eat regular, healthy meals.  Wear a good support bra for breast tenderness.  Do not use hot tubs, steam rooms, or saunas.  Wear your seat belt at all times when driving.  Avoid raw meat, uncooked cheese, cat litter boxes, and soil used by cats. These carry germs that can cause birth defects in the baby.  Take your prenatal vitamins.  Take 1500-2000 mg of calcium daily starting at the 20th week of pregnancy until you deliver your baby.  Try taking a stool softener (if your health care provider approves) if you develop constipation. Eat more high-fiber foods, such as fresh vegetables or fruit and whole grains. Drink plenty of fluids to keep your urine clear or pale yellow.  Take warm sitz baths to soothe any pain or discomfort caused by hemorrhoids. Use hemorrhoid cream if your health care provider approves.  If you develop varicose veins, wear support hose. Elevate your  feet for 15 minutes, 3-4 times a day. Limit salt in your diet.  Avoid heavy lifting, wear low heel shoes, and practice good posture.  Rest with your legs elevated if you have leg cramps or low back pain.  Visit your dentist if you have not gone yet during your pregnancy. Use a soft toothbrush to brush your teeth and be gentle when you floss.  A sexual relationship may be continued unless your health care provider directs you otherwise.  Continue to go to all your prenatal  visits as directed by your health care provider. SEEK MEDICAL CARE IF:   You have dizziness.  You have mild pelvic cramps, pelvic pressure, or nagging pain in the abdominal area.  You have persistent nausea, vomiting, or diarrhea.  You have a bad smelling vaginal discharge.  You have pain with urination. SEEK IMMEDIATE MEDICAL CARE IF:   You have a fever.  You are leaking fluid from your vagina.  You have spotting or bleeding from your vagina.  You have severe abdominal cramping or pain.  You have rapid weight gain or loss.  You have shortness of breath with chest pain.  You notice sudden or extreme swelling of your face, hands, ankles, feet, or legs.  You have not felt your baby move in over an hour.  You have severe headaches that do not go away with medicine.  You have vision changes.   This information is not intended to replace advice given to you by your health care provider. Make sure you discuss any questions you have with your health care provider.   Document Released: 08/18/2001 Document Revised: 09/14/2014 Document Reviewed: 10/25/2012 Elsevier Interactive Patient Education [employer]2016 Elsevier Inc.

## 2016-05-14 ENCOUNTER — Ambulatory Visit (INDEPENDENT_AMBULATORY_CARE_PROVIDER_SITE_OTHER): Payer: Managed Care, Other (non HMO) | Admitting: Certified Nurse Midwife

## 2016-05-14 ENCOUNTER — Other Ambulatory Visit: Payer: Managed Care, Other (non HMO)

## 2016-05-14 DIAGNOSIS — Z3482 Encounter for supervision of other normal pregnancy, second trimester: Secondary | ICD-10-CM

## 2016-05-14 NOTE — Addendum Note (Signed)
Addended by: Marya LandryFOSTER, Collie Kittel D on: 05/14/2016 04:23 PM   Modules accepted: Orders

## 2016-05-14 NOTE — Progress Notes (Signed)
Subjective:    Colleen Graham is a 25 y.o. female being seen today for her obstetrical visit. She is at 5324w3d gestation. Patient reports: no complaints . Fetal movement: normal.  Problem List Items Addressed This Visit      Other   Encounter for supervision of other normal pregnancy in second trimester    Other Visit Diagnoses   None.    Patient Active Problem List   Diagnosis Date Noted  . Encounter for supervision of other normal pregnancy in second trimester 01/26/2016   Objective:    BP 130/75   Pulse 82   Temp 98.8 F (37.1 C)   Wt 214 lb 3.2 oz (97.2 kg)   LMP 11/04/2015 (LMP Unknown)   BMI 32.57 kg/m  FHT: 145 BPM  Uterine Size: 27 cm and size equals dates     Assessment:    Pregnancy @ 3524w3d    Doing well Plan:   Aeroflow Rx completed   OBGCT: discussed and ordered. Signs and symptoms of preterm labor: discussed.  Labs, problem list reviewed and updated 2 hr GTT toay Follow up in 2 weeks.

## 2016-05-14 NOTE — Patient Instructions (Signed)
Before [facility] Before your baby arrives it is important to:  Have all of the supplies that you will need to care for your baby.  Know where to go if there is an emergency.  Discuss the baby's arrival with other family members. WHAT SUPPLIES WILL I NEED? It is recommended that you have the following supplies: Large Items  Crib.  Crib mattress.  Rear-facing infant car seat. If possible, have a trained professional check to make sure that it is installed correctly. Feeding  6-8 bottles that are 4-5 oz in size.  6-8 nipples.  Bottle brush.  Sterilizer, or a large pan or kettle with a lid.  A way to boil and cool water.  If you will be breastfeeding:  Breast pump.  Nipple cream.  Nursing bra.  Breast pads.  Breast shields.  If you will be formula feeding:  Formula.  Measuring cups.  Measuring spoons. Bathing  Mild baby soap and baby shampoo.  Petroleum jelly.  Soft cloth towel and washcloth.  Hooded towel.  Cotton balls.  Bath basin. Other Supplies  Rectal thermometer.  Bulb syringe.  Baby wipes or washcloths for diaper changes.  Diaper bag.  Changing pad.  Clothing, including one-piece outfits and pajamas.  Baby nail clippers.  Receiving blankets.  Mattress pad and sheets for the crib.  Night-light for the baby's room.  Baby monitor.  2 or 3 pacifiers.  Either 24-36 cloth diapers and waterproof diaper covers or a box of disposable diapers. You may need to use as many as 10-12 diapers per day. HOW DO I PREPARE FOR AN EMERGENCY? Prepare for an emergency by:  Knowing how to get to the nearest hospital.  Listing the phone numbers of your baby's health care providers near your home phone and in your cell phone. HOW DO I PREPARE MY FAMILY?  Decide how to handle visitors.  If you have other children:  Talk with them about the baby coming home. Ask them how they feel about it.  Read a book together about being a new big  brother or sister.  Find ways to let them help you prepare for the new baby.  Have someone ready to care for them while you are in the hospital.   This information is not intended to replace advice given to you by your health care provider. Make sure you discuss any questions you have with your health care provider.   Document Released: 08/06/2008 Document Revised: 01/08/2015 Document Reviewed: 08/01/2014 Elsevier Interactive Patient Education 2016 [employer]. Second Trimester of Pregnancy The second trimester is from week 13 through week 28, months 4 through 6. The second trimester is often a time when you feel your best. Your body has also adjusted to being pregnant, and you begin to feel better physically. Usually, morning sickness has lessened or quit completely, you may have more energy, and you may have an increase in appetite. The second trimester is also a time when the fetus is growing rapidly. At the end of the sixth month, the fetus is about 9 inches long and weighs about 1 pounds. You will likely begin to feel the baby move (quickening) between 18 and 20 weeks of the pregnancy. BODY CHANGES Your body goes through many changes during pregnancy. The changes vary from woman to woman.   Your weight will continue to increase. You will notice your lower abdomen bulging out.  You may begin to get stretch marks on your hips, abdomen, and breasts.  You may  develop headaches that can be relieved by medicines approved by your health care provider.  You may urinate more often because the fetus is pressing on your bladder.  You may develop or continue to have heartburn as a result of your pregnancy.  You may develop constipation because certain hormones are causing the muscles that push waste through your intestines to slow down.  You may develop hemorrhoids or swollen, bulging veins (varicose veins).  You may have back pain because of the weight gain and pregnancy hormones relaxing  your joints between the bones in your pelvis and as a result of a shift in weight and the muscles that support your balance.  Your breasts will continue to grow and be tender.  Your gums may bleed and may be sensitive to brushing and flossing.  Dark spots or blotches (chloasma, mask of pregnancy) may develop on your face. This will likely fade after the baby is born.  A dark line from your belly button to the pubic area (linea nigra) may appear. This will likely fade after the baby is born.  You may have changes in your hair. These can include thickening of your hair, rapid growth, and changes in texture. Some women also have hair loss during or after pregnancy, or hair that feels dry or thin. Your hair will most likely return to normal after your baby is born. WHAT TO EXPECT AT YOUR PRENATAL VISITS During a routine prenatal visit:  You will be weighed to make sure you and the fetus are growing normally.  Your blood pressure will be taken.  Your abdomen will be measured to track your baby's growth.  The fetal heartbeat will be listened to.  Any test results from the previous visit will be discussed. Your health care provider may ask you:  How you are feeling.  If you are feeling the baby move.  If you have had any abnormal symptoms, such as leaking fluid, bleeding, severe headaches, or abdominal cramping.  If you are using any tobacco products, including cigarettes, chewing tobacco, and electronic cigarettes.  If you have any questions. Other tests that may be performed during your second trimester include:  Blood tests that check for:  Low iron levels (anemia).  Gestational diabetes (between 24 and 28 weeks).  Rh antibodies.  Urine tests to check for infections, diabetes, or protein in the urine.  An ultrasound to confirm the proper growth and development of the baby.  An amniocentesis to check for possible genetic problems.  Fetal screens for spina bifida and Down  syndrome.  HIV (human immunodeficiency virus) testing. Routine prenatal testing includes screening for HIV, unless you choose not to have this test. HOME CARE INSTRUCTIONS   Avoid all smoking, herbs, alcohol, and unprescribed drugs. These chemicals affect the formation and growth of the baby.  Do not use any tobacco products, including cigarettes, chewing tobacco, and electronic cigarettes. If you need help quitting, ask your health care provider. You may receive counseling support and other resources to help you quit.  Follow your health care provider's instructions regarding medicine use. There are medicines that are either safe or unsafe to take during pregnancy.  Exercise only as directed by your health care provider. Experiencing uterine cramps is a good sign to stop exercising.  Continue to eat regular, healthy meals.  Wear a good support bra for breast tenderness.  Do not use hot tubs, steam rooms, or saunas.  Wear your seat belt at all times when driving.  Avoid raw  meat, uncooked cheese, cat litter boxes, and soil used by cats. These carry germs that can cause birth defects in the baby.  Take your prenatal vitamins.  Take 1500-2000 mg of calcium daily starting at the 20th week of pregnancy until you deliver your baby.  Try taking a stool softener (if your health care provider approves) if you develop constipation. Eat more high-fiber foods, such as fresh vegetables or fruit and whole grains. Drink plenty of fluids to keep your urine clear or pale yellow.  Take warm sitz baths to soothe any pain or discomfort caused by hemorrhoids. Use hemorrhoid cream if your health care provider approves.  If you develop varicose veins, wear support hose. Elevate your feet for 15 minutes, 3-4 times a day. Limit salt in your diet.  Avoid heavy lifting, wear low heel shoes, and practice good posture.  Rest with your legs elevated if you have leg cramps or low back pain.  Visit your  dentist if you have not gone yet during your pregnancy. Use a soft toothbrush to brush your teeth and be gentle when you floss.  A sexual relationship may be continued unless your health care provider directs you otherwise.  Continue to go to all your prenatal visits as directed by your health care provider. SEEK MEDICAL CARE IF:   You have dizziness.  You have mild pelvic cramps, pelvic pressure, or nagging pain in the abdominal area.  You have persistent nausea, vomiting, or diarrhea.  You have a bad smelling vaginal discharge.  You have pain with urination. SEEK IMMEDIATE MEDICAL CARE IF:   You have a fever.  You are leaking fluid from your vagina.  You have spotting or bleeding from your vagina.  You have severe abdominal cramping or pain.  You have rapid weight gain or loss.  You have shortness of breath with chest pain.  You notice sudden or extreme swelling of your face, hands, ankles, feet, or legs.  You have not felt your baby move in over an hour.  You have severe headaches that do not go away with medicine.  You have vision changes.   This information is not intended to replace advice given to you by your health care provider. Make sure you discuss any questions you have with your health care provider.   Document Released: 08/18/2001 Document Revised: 09/14/2014 Document Reviewed: 10/25/2012 Elsevier Interactive Patient Education [employer]

## 2016-05-14 NOTE — Progress Notes (Signed)
.  Pt states she is feeling well today.

## 2016-05-14 NOTE — Progress Notes (Signed)
Pt is unable to void. 

## 2016-05-15 ENCOUNTER — Other Ambulatory Visit: Payer: Self-pay | Admitting: Certified Nurse Midwife

## 2016-05-15 DIAGNOSIS — Z3482 Encounter for supervision of other normal pregnancy, second trimester: Secondary | ICD-10-CM

## 2016-05-15 LAB — CBC
HEMATOCRIT: 32.7 % — AB (ref 34.0–46.6)
HEMOGLOBIN: 11 g/dL — AB (ref 11.1–15.9)
MCH: 28.4 pg (ref 26.6–33.0)
MCHC: 33.6 g/dL (ref 31.5–35.7)
MCV: 84 fL (ref 79–97)
Platelets: 221 10*3/uL (ref 150–379)
RBC: 3.88 x10E6/uL (ref 3.77–5.28)
RDW: 13.3 % (ref 12.3–15.4)
WBC: 6 10*3/uL (ref 3.4–10.8)

## 2016-05-15 LAB — GLUCOSE TOLERANCE, 2 HOURS W/ 1HR
Glucose, 1 hour: 116 mg/dL (ref 65–179)
Glucose, 2 hour: 101 mg/dL (ref 65–152)
Glucose, Fasting: 78 mg/dL (ref 65–91)

## 2016-05-15 LAB — RPR: RPR: NONREACTIVE

## 2016-05-15 LAB — HIV ANTIBODY (ROUTINE TESTING W REFLEX): HIV Screen 4th Generation wRfx: NONREACTIVE

## 2016-05-28 ENCOUNTER — Ambulatory Visit (INDEPENDENT_AMBULATORY_CARE_PROVIDER_SITE_OTHER): Payer: Managed Care, Other (non HMO) | Admitting: Obstetrics and Gynecology

## 2016-05-28 DIAGNOSIS — Z3482 Encounter for supervision of other normal pregnancy, second trimester: Secondary | ICD-10-CM

## 2016-05-28 NOTE — Progress Notes (Signed)
   PRENATAL VISIT NOTE  Subjective:  Colleen Graham is a 25 y.o. G2P1 at 438w3d being seen today for ongoing prenatal care.  She is currently monitored for the following issues for this low-risk pregnancy and has Encounter for supervision of other normal pregnancy in second trimester on her problem list.  Patient reports no complaints.  Contractions: Not present. Vag. Bleeding: None.  Movement: Present. Denies leaking of fluid.   The following portions of the patient's history were reviewed and updated as appropriate: allergies, current medications, past family history, past medical history, past social history, past surgical history and problem list. Problem list updated.  Objective:   Vitals:   05/28/16 1141  BP: 109/70  Pulse: 92  Temp: 99 F (37.2 C)  Weight: 220 lb (99.8 kg)    Fetal Status: Fetal Heart Rate (bpm): 148 Fundal Height: 29 cm Movement: Present     General:  Alert, oriented and cooperative. Patient is in no acute distress.  Skin: Skin is warm and dry. No rash noted.   Cardiovascular: Normal heart rate noted  Respiratory: Normal respiratory effort, no problems with respiration noted  Abdomen: Soft, gravid, appropriate for gestational age. Pain/Pressure: Absent     Pelvic:  Cervical exam deferred        Extremities: Normal range of motion.  Edema: None  Mental Status: Normal mood and affect. Normal behavior. Normal judgment and thought content.   Urinalysis:      Assessment and Plan:  Pregnancy: G2P1 at 1338w3d  1. Encounter for supervision of other normal pregnancy in second trimester Patient is doing well without complaints Declined Flu Desires Tdap- offer at next visit Reviewed 2 hour glucola and labs with the patient Patient remains undecided on contraception  Preterm labor symptoms and general obstetric precautions including but not limited to vaginal bleeding, contractions, leaking of fluid and fetal movement were reviewed in detail with the  patient. Please refer to After Visit Summary for other counseling recommendations.  Return in about 2 weeks (around 06/11/2016).  Catalina AntiguaPeggy Mishawn Didion, MD

## 2016-05-28 NOTE — Progress Notes (Signed)
Patient is in office and states that she overall feels well. Patient declined flu vaccine.

## 2016-06-16 ENCOUNTER — Encounter: Payer: Self-pay | Admitting: Certified Nurse Midwife

## 2016-07-06 ENCOUNTER — Encounter: Payer: Self-pay | Admitting: Obstetrics

## 2016-07-06 ENCOUNTER — Ambulatory Visit (INDEPENDENT_AMBULATORY_CARE_PROVIDER_SITE_OTHER): Payer: Medicaid Other | Admitting: Obstetrics

## 2016-07-06 VITALS — BP 112/68 | HR 102 | Temp 98.5°F | Wt 221.9 lb

## 2016-07-06 DIAGNOSIS — Z3483 Encounter for supervision of other normal pregnancy, third trimester: Secondary | ICD-10-CM | POA: Diagnosis not present

## 2016-07-06 DIAGNOSIS — Z23 Encounter for immunization: Secondary | ICD-10-CM | POA: Diagnosis not present

## 2016-07-06 DIAGNOSIS — Z3403 Encounter for supervision of normal first pregnancy, third trimester: Secondary | ICD-10-CM

## 2016-07-06 DIAGNOSIS — Z3482 Encounter for supervision of other normal pregnancy, second trimester: Secondary | ICD-10-CM

## 2016-07-06 LAB — OB RESULTS CONSOLE GBS: STREP GROUP B AG: NEGATIVE

## 2016-07-06 NOTE — Progress Notes (Signed)
Patient is doing well today.  

## 2016-07-06 NOTE — Progress Notes (Deleted)
Patient is in the office, states that mucous plug has been coming out since last week, reports good fetal movement.

## 2016-07-06 NOTE — Progress Notes (Signed)
Subjective:    Anne Fushkelon Q Meding is a 25 y.o. female being seen today for her obstetrical visit. She is at 7642w0d gestation. Patient reports no complaints. Fetal movement: normal.  Problem List Items Addressed This Visit    Encounter for supervision of other normal pregnancy in second trimester    Other Visit Diagnoses    Need for vaccine for TD (tetanus-diphtheria)    -  Primary   Relevant Orders   Tdap vaccine greater than or equal to 27yo IM (Completed)   Encounter for supervision of normal first pregnancy in third trimester       Relevant Orders   Culture, beta strep (group b only)     Patient Active Problem List   Diagnosis Date Noted  . Encounter for supervision of other normal pregnancy in second trimester 01/26/2016   Objective:    BP 112/68   Pulse (!) 102   Temp 98.5 F (36.9 C)   Wt 221 lb 14.4 oz (100.7 kg)   LMP 11/04/2015 (LMP Unknown)   BMI 33.74 kg/m  FHT:  150 BPM  Uterine Size: size equals dates  Presentation: unsure     Assessment:    Pregnancy @ 6542w0d weeks   Plan:     labs reviewed, problem list updated Consent signed. GBS sent TDAP offered  Rhogam given for RH negative Pediatrician: discussed. Infant feeding: plans to breastfeed. Maternity leave: discussed. Cigarette smoking: never smoked. Orders Placed This Encounter  Procedures  . Culture, beta strep (group b only)  . Tdap vaccine greater than or equal to 27yo IM   No orders of the defined types were placed in this encounter.  Follow up in 1 Week.   Patient ID: Anne Fushkelon Q Mcintire, female   DOB: Oct 09, 1990, 25 y.o.   MRN: [id number]

## 2016-07-10 LAB — CULTURE, BETA STREP (GROUP B ONLY): Strep Gp B Culture: NEGATIVE

## 2016-07-16 ENCOUNTER — Ambulatory Visit (INDEPENDENT_AMBULATORY_CARE_PROVIDER_SITE_OTHER): Payer: Medicaid Other | Admitting: Certified Nurse Midwife

## 2016-07-16 DIAGNOSIS — Z3483 Encounter for supervision of other normal pregnancy, third trimester: Secondary | ICD-10-CM | POA: Diagnosis not present

## 2016-07-16 NOTE — Progress Notes (Signed)
Subjective:    Colleen Graham is a 25 y.o. female being seen today for her obstetrical visit. She is at 7221w3d gestation. Patient reports no complaints. Fetal movement: normal.  Problem List Items Addressed This Visit      Other   Encounter for supervision of other normal pregnancy in second trimester    Third trimester of pregnancy  Patient Active Problem List   Diagnosis Date Noted  . Encounter for supervision of other normal pregnancy in second trimester 01/26/2016   Objective:    BP 104/68   Pulse (!) 103   Wt 228 lb (103.4 kg)   LMP 11/04/2015 (LMP Unknown)   BMI 34.67 kg/m  FHT:  136 BPM  Uterine Size: 37 cm and size equals dates  Presentation: cephalic     Assessment:    Pregnancy @ 6921w3d weeks   Doing well  Plan:     labs reviewed, problem list updated Consent signed. GBS results reviewed TDAP offered  Rhogam given for RH negative Pediatrician: discussed. Infant feeding: plans to breastfeed. Maternity leave: discussed. Cigarette smoking: never smoked. No orders of the defined types were placed in this encounter.  No orders of the defined types were placed in this encounter.  Follow up in 1 Week.

## 2016-07-24 ENCOUNTER — Ambulatory Visit (INDEPENDENT_AMBULATORY_CARE_PROVIDER_SITE_OTHER): Payer: Medicaid Other | Admitting: Certified Nurse Midwife

## 2016-07-24 VITALS — BP 118/75 | HR 91 | Wt 226.0 lb

## 2016-07-24 DIAGNOSIS — Z3483 Encounter for supervision of other normal pregnancy, third trimester: Secondary | ICD-10-CM

## 2016-07-24 NOTE — Progress Notes (Signed)
Subjective:    Anne Fushkelon Q Duba is a 25 y.o. female being seen today for her obstetrical visit. She is at 6722w4d gestation. Patient reports no complaints. Fetal movement: normal.  Problem List Items Addressed This Visit      Other   Encounter for supervision of other normal pregnancy in second trimester - Primary     Patient Active Problem List   Diagnosis Date Noted  . Encounter for supervision of other normal pregnancy in second trimester 01/26/2016    Objective:    BP 118/75   Pulse 91   Wt 226 lb (102.5 kg)   LMP 11/04/2015 (LMP Unknown)   BMI 34.36 kg/m  FHT: 135 BPM  Uterine Size: 37 cm and size equals dates  Presentations: unsure  Pelvic Exam: deferred     Assessment:    Pregnancy @ 4922w4d weeks   doing well  Plan:   Plans for delivery: Vaginal anticipated; labs reviewed; problem list updated Counseling: Consent signed. Infant feeding: plans to breastfeed. Cigarette smoking: never smoked. L&D discussion: symptoms of labor, discussed when to call, discussed what number to call, anesthetic/analgesic options reviewed and delivering clinician:  plans Certified Nurse-Midwife. Postpartum supports and preparation: circumcision discussed and contraception plans discussed.  Follow up in 1 Week.

## 2016-08-04 ENCOUNTER — Ambulatory Visit (INDEPENDENT_AMBULATORY_CARE_PROVIDER_SITE_OTHER): Payer: Medicaid Other | Admitting: Certified Nurse Midwife

## 2016-08-04 VITALS — BP 111/68 | HR 92 | Wt 236.0 lb

## 2016-08-04 DIAGNOSIS — Z3493 Encounter for supervision of normal pregnancy, unspecified, third trimester: Secondary | ICD-10-CM | POA: Diagnosis not present

## 2016-08-04 NOTE — Progress Notes (Signed)
IOL scheduled at today's visit. Pt scheduled 08/11/16 at 0630am.  Pt is aware.

## 2016-08-04 NOTE — Progress Notes (Signed)
Subjective:    Colleen Graham is a 25 y.o. female being seen today for her obstetrical visit. She is at 5481w1d gestation. Patient reports no complaints. Fetal movement: normal.  Problem List Items Addressed This Visit      Other   Supervision of low-risk pregnancy - Primary     Patient Active Problem List   Diagnosis Date Noted  . Supervision of low-risk pregnancy 01/26/2016    Objective:    BP 111/68   Pulse 92   Wt 236 lb (107 kg)   LMP 11/04/2015 (LMP Unknown)   BMI 35.88 kg/m  FHT: 135 BPM  Uterine Size: 40 cm and size equals dates  Presentations: cephalic  Pelvic Exam:              Dilation: 1cm       Effacement: Long             Station:  -3    Consistency: firm            Position: posterior     Assessment:    Pregnancy @ 1981w1d weeks   Probable LGA infant, last child was 8#14oz  Plan:   Plans for delivery: Vaginal anticipated; labs reviewed; problem list updated  IOL scheduled Counseling: Consent signed. Infant feeding: plans to breastfeed. Cigarette smoking: never smoked. L&D discussion: symptoms of labor, discussed when to call, discussed what number to call, anesthetic/analgesic options reviewed and delivering clinician:  plans no preference. Postpartum supports and preparation: circumcision discussed and contraception plans discussed.  Follow up in 1 Week with NST.

## 2016-08-05 ENCOUNTER — Telehealth (HOSPITAL_COMMUNITY): Payer: Self-pay | Admitting: *Deleted

## 2016-08-05 NOTE — Telephone Encounter (Signed)
Preadmission screen  

## 2016-08-10 ENCOUNTER — Encounter: Payer: Self-pay | Admitting: Certified Nurse Midwife

## 2016-08-10 ENCOUNTER — Other Ambulatory Visit: Payer: Self-pay | Admitting: Certified Nurse Midwife

## 2016-08-11 ENCOUNTER — Inpatient Hospital Stay (HOSPITAL_COMMUNITY)
Admission: RE | Admit: 2016-08-11 | Payer: Medicaid Other | Source: Ambulatory Visit | Admitting: Obstetrics & Gynecology

## 2016-08-11 ENCOUNTER — Ambulatory Visit (INDEPENDENT_AMBULATORY_CARE_PROVIDER_SITE_OTHER): Payer: Medicaid Other

## 2016-08-11 ENCOUNTER — Ambulatory Visit (INDEPENDENT_AMBULATORY_CARE_PROVIDER_SITE_OTHER): Payer: Medicaid Other | Admitting: Certified Nurse Midwife

## 2016-08-11 DIAGNOSIS — Z3493 Encounter for supervision of normal pregnancy, unspecified, third trimester: Secondary | ICD-10-CM

## 2016-08-11 NOTE — Patient Instructions (Addendum)
Third Trimester of Pregnancy The third trimester is from week 29 through week 40 (months 7 through 9). The third trimester is a time when the unborn baby (fetus) is growing rapidly. At the end of the ninth month, the fetus is about 20 inches in length and weighs 6-10 pounds. Body changes during your third trimester Your body goes through many changes during pregnancy. The changes vary from woman to woman. During the third trimester:  Your weight will continue to increase. You can expect to gain 25-35 pounds (11-16 kg) by the end of the pregnancy.  You may begin to get stretch marks on your hips, abdomen, and breasts.  You may urinate more often because the fetus is moving lower into your pelvis and pressing on your bladder.  You may develop or continue to have heartburn. This is caused by increased hormones that slow down muscles in the digestive tract.  You may develop or continue to have constipation because increased hormones slow digestion and cause the muscles that push waste through your intestines to relax.  You may develop hemorrhoids. These are swollen veins (varicose veins) in the rectum that can itch or be painful.  You may develop swollen, bulging veins (varicose veins) in your legs.  You may have increased body aches in the pelvis, back, or thighs. This is due to weight gain and increased hormones that are relaxing your joints.  You may have changes in your hair. These can include thickening of your hair, rapid growth, and changes in texture. Some women also have hair loss during or after pregnancy, or hair that feels dry or thin. Your hair will most likely return to normal after your baby is born.  Your breasts will continue to grow and they will continue to become tender. A yellow fluid (colostrum) may leak from your breasts. This is the first milk you are producing for your baby.  Your belly button may stick out.  You may notice more swelling in your hands, face, or  ankles.  You may have increased tingling or numbness in your hands, arms, and legs. The skin on your belly may also feel numb.  You may feel short of breath because of your expanding uterus.  You may have more problems sleeping. This can be caused by the size of your belly, increased need to urinate, and an increase in your body's metabolism.  You may notice the fetus "dropping," or moving lower in your abdomen.  You may have increased vaginal discharge.  Your cervix becomes thin and soft (effaced) near your due date. What to expect at prenatal visits You will have prenatal exams every 2 weeks until week 36. Then you will have weekly prenatal exams. During a routine prenatal visit:  You will be weighed to make sure you and the fetus are growing normally.  Your blood pressure will be taken.  Your abdomen will be measured to track your baby's growth.  The fetal heartbeat will be listened to.  Any test results from the previous visit will be discussed.  You may have a cervical check near your due date to see if you have effaced. At around 36 weeks, your health care provider will check your cervix. At the same time, your health care provider will also perform a test on the secretions of the vaginal tissue. This test is to determine if a type of bacteria, Group B streptococcus, is present. Your health care provider will explain this further. Your health care provider may ask you:    What your birth plan is.  How you are feeling.  If you are feeling the baby move.  If you have had any abnormal symptoms, such as leaking fluid, bleeding, severe headaches, or abdominal cramping.  If you are using any tobacco products, including cigarettes, chewing tobacco, and electronic cigarettes.  If you have any questions. Other tests or screenings that may be performed during your third trimester include:  Blood tests that check for low iron levels (anemia).  Fetal testing to check the health,  activity level, and growth of the fetus. Testing is done if you have certain medical conditions or if there are problems during the pregnancy.  Nonstress test (NST). This test checks the health of your baby to make sure there are no signs of problems, such as the baby not getting enough oxygen. During this test, a belt is placed around your belly. The baby is made to move, and its heart rate is monitored during movement. What is false labor? False labor is a condition in which you feel small, irregular tightenings of the muscles in the womb (contractions) that eventually go away. These are called Braxton Hicks contractions. Contractions may last for hours, days, or even weeks before true labor sets in. If contractions come at regular intervals, become more frequent, increase in intensity, or become painful, you should see your health care provider. What are the signs of labor?  Abdominal cramps.  Regular contractions that start at 10 minutes apart and become stronger and more frequent with time.  Contractions that start on the top of the uterus and spread down to the lower abdomen and back.  Increased pelvic pressure and dull back pain.  A watery or bloody mucus discharge that comes from the vagina.  Leaking of amniotic fluid. This is also known as your "water breaking." It could be a slow trickle or a gush. Let your doctor know if it has a color or strange odor. If you have any of these signs, call your health care provider right away, even if it is before your due date. Follow these instructions at home: Eating and drinking  Continue to eat regular, healthy meals.  Do not eat:  Raw meat or meat spreads.  Unpasteurized milk or cheese.  Unpasteurized juice.  Store-made salad.  Refrigerated smoked seafood.  Hot dogs or deli meat, unless they are piping hot.  More than 6 ounces of albacore tuna a week.  Shark, swordfish, king mackerel, or tile fish.  Store-made salads.  Raw  sprouts, such as mung bean or alfalfa sprouts.  Take prenatal vitamins as told by your health care provider.  Take 1000 mg of calcium daily as told by your health care provider.  If you develop constipation:  Take over-the-counter or prescription medicines.  Drink enough fluid to keep your urine clear or pale yellow.  Eat foods that are high in fiber, such as fresh fruits and vegetables, whole grains, and beans.  Limit foods that are high in fat and processed sugars, such as fried and sweet foods. Activity  Exercise only as directed by your health care provider. Healthy pregnant women should aim for 2 hours and 30 minutes of moderate exercise per week. If you experience any pain or discomfort while exercising, stop.  Avoid heavy lifting.  Do not exercise in extreme heat or humidity, or at high altitudes.  Wear low-heel, comfortable shoes.  Practice good posture.  Do not travel far distances unless it is absolutely necessary and only with the approval   of your health care provider.  Wear your seat belt at all times while in a car, on a bus, or on a plane.  Take frequent breaks and rest with your legs elevated if you have leg cramps or low back pain.  Do not use hot tubs, steam rooms, or saunas.  You may continue to have sex unless your health care provider tells you otherwise. Lifestyle  Do not use any products that contain nicotine or tobacco, such as cigarettes and e-cigarettes. If you need help quitting, ask your health care provider.  Do not drink alcohol.  Do not use any medicinal herbs or unprescribed drugs. These chemicals affect the formation and growth of the baby.  If you develop varicose veins:  Wear support pantyhose or compression stockings as told by your healthcare provider.  Elevate your feet for 15 minutes, 3-4 times a day.  Wear a supportive maternity bra to help with breast tenderness. General instructions  Take over-the-counter and prescription  medicines only as told by your health care provider. There are medicines that are either safe or unsafe to take during pregnancy.  Take warm sitz baths to soothe any pain or discomfort caused by hemorrhoids. Use hemorrhoid cream or witch hazel if your health care provider approves.  Avoid cat litter boxes and soil used by cats. These carry germs that can cause birth defects in the baby. If you have a cat, ask someone to clean the litter box for you.  To prepare for the arrival of your baby:  Take prenatal classes to understand, practice, and ask questions about the labor and delivery.  Make a trial run to the hospital.  Visit the hospital and tour the maternity area.  Arrange for maternity or paternity leave through employers.  Arrange for family and friends to take care of pets while you are in the hospital.  Purchase a rear-facing car seat and make sure you know how to install it in your car.  Pack your hospital bag.  Prepare the baby's nursery. Make sure to remove all pillows and stuffed animals from the baby's crib to prevent suffocation.  Visit your dentist if you have not gone during your pregnancy. Use a soft toothbrush to brush your teeth and be gentle when you floss.  Keep all prenatal follow-up visits as told by your health care provider. This is important. Contact a health care provider if:  You are unsure if you are in labor or if your water has broken.  You become dizzy.  You have mild pelvic cramps, pelvic pressure, or nagging pain in your abdominal area.  You have lower back pain.  You have persistent nausea, vomiting, or diarrhea.  You have an unusual or bad smelling vaginal discharge.  You have pain when you urinate. Get help right away if:  You have a fever.  You are leaking fluid from your vagina.  You have spotting or bleeding from your vagina.  You have severe abdominal pain or cramping.  You have rapid weight loss or weight gain.  You have  shortness of breath with chest pain.  You notice sudden or extreme swelling of your face, hands, ankles, feet, or legs.  Your baby makes fewer than 10 movements in 2 hours.  You have severe headaches that do not go away with medicine.  You have vision changes. Summary  The third trimester is from week 29 through week 40, months 7 through 9. The third trimester is a time when the unborn baby (fetus)   is growing rapidly.  During the third trimester, your discomfort may increase as you and your baby continue to gain weight. You may have abdominal, leg, and back pain, sleeping problems, and an increased need to urinate.  During the third trimester your breasts will keep growing and they will continue to become tender. A yellow fluid (colostrum) may leak from your breasts. This is the first milk you are producing for your baby.  False labor is a condition in which you feel small, irregular tightenings of the muscles in the womb (contractions) that eventually go away. These are called Braxton Hicks contractions. Contractions may last for hours, days, or even weeks before true labor sets in.  Signs of labor can include: abdominal cramps; regular contractions that start at 10 minutes apart and become stronger and more frequent with time; watery or bloody mucus discharge that comes from the vagina; increased pelvic pressure and dull back pain; and leaking of amniotic fluid. This information is not intended to replace advice given to you by your health care provider. Make sure you discuss any questions you have with your health care provider. Document Released: 08/18/2001 Document Revised: 01/30/2016 Document Reviewed: 10/25/2012 Elsevier Interactive Patient Education  2017 [employer]  

## 2016-08-11 NOTE — Addendum Note (Signed)
Addended by: Samantha CrimesENNEY, RACHELLE ANNE on: 08/11/2016 02:59 PM   Modules accepted: Orders, SmartSet

## 2016-08-11 NOTE — Progress Notes (Signed)
Subjective:    Colleen Graham is a 25 y.o. female being seen today for her obstetrical visit. She is at 2473w1d gestation. Patient reports backache, no bleeding, no leaking, occasional contractions and pelvic pressure. Fetal movement: normal.  Problem List Items Addressed This Visit      Other   Supervision of low-risk pregnancy     Patient Active Problem List   Diagnosis Date Noted  . Supervision of low-risk pregnancy 01/26/2016    Objective:    BP 105/69   Pulse 97   Wt 238 lb (108 kg)   LMP 11/04/2015 (LMP Unknown)   BMI 36.19 kg/m  FHT:  148 BPM  Uterine Size: 42 cm and size greater than dates  Presentation: cephalic  Pelvic Exam:              Dilation: 1cm       Effacement: Long   Station:  -3     Consistency: medium            Position: posterior   US results: discussed with Dr. Joneen BoersAnnawanu/POC: induction at 41 weeks.   Assessment:    Pregnancy @ 4773w1d  weeks   BPP normal  LGA by US: EFW: 9#6oz  Plan:   [facility]  Postdates management: discussed fetal surveillance and induction, discussed fetal movement, biophysical profile ordered. Induction: scheduled for 08/18/16, written information given.  Follow up in  4 weeks postpartum.

## 2016-08-17 ENCOUNTER — Inpatient Hospital Stay (HOSPITAL_COMMUNITY)
Admission: AD | Admit: 2016-08-17 | Discharge: 2016-08-19 | DRG: 775 | Disposition: A | Discharge status: Home / Self Care | Payer: Medicaid Other | Source: Ambulatory Visit | Attending: Obstetrics & Gynecology | Admitting: Obstetrics & Gynecology

## 2016-08-17 ENCOUNTER — Encounter (HOSPITAL_COMMUNITY): Payer: Self-pay

## 2016-08-17 DIAGNOSIS — IMO0002 Reserved for concepts with insufficient information to code with codable children: Secondary | ICD-10-CM | POA: Diagnosis present

## 2016-08-17 DIAGNOSIS — Z3493 Encounter for supervision of normal pregnancy, unspecified, third trimester: Secondary | ICD-10-CM | POA: Diagnosis present

## 2016-08-17 DIAGNOSIS — Z3A41 41 weeks gestation of pregnancy: Secondary | ICD-10-CM

## 2016-08-17 DIAGNOSIS — O480 Post-term pregnancy: Secondary | ICD-10-CM

## 2016-08-17 LAB — TYPE AND SCREEN
ABO/RH(D): B POS
ANTIBODY SCREEN: NEGATIVE

## 2016-08-17 LAB — COMPREHENSIVE METABOLIC PANEL
ALT: 10 U/L — AB (ref 14–54)
AST: 19 U/L (ref 15–41)
Albumin: 2.8 g/dL — ABNORMAL LOW (ref 3.5–5.0)
Alkaline Phosphatase: 220 U/L — ABNORMAL HIGH (ref 38–126)
Anion gap: 11 (ref 5–15)
BILIRUBIN TOTAL: 0.2 mg/dL — AB (ref 0.3–1.2)
BUN: 7 mg/dL (ref 6–20)
CHLORIDE: 104 mmol/L (ref 101–111)
CO2: 19 mmol/L — ABNORMAL LOW (ref 22–32)
CREATININE: 0.64 mg/dL (ref 0.44–1.00)
Calcium: 9.1 mg/dL (ref 8.9–10.3)
Glucose, Bld: 135 mg/dL — ABNORMAL HIGH (ref 65–99)
Potassium: 3.8 mmol/L (ref 3.5–5.1)
Sodium: 134 mmol/L — ABNORMAL LOW (ref 135–145)
TOTAL PROTEIN: 5.9 g/dL — AB (ref 6.5–8.1)

## 2016-08-17 LAB — CBC
HEMATOCRIT: 31.3 % — AB (ref 36.0–46.0)
Hemoglobin: 10.6 g/dL — ABNORMAL LOW (ref 12.0–15.0)
MCH: 26 pg (ref 26.0–34.0)
MCHC: 33.9 g/dL (ref 30.0–36.0)
MCV: 76.9 fL — AB (ref 78.0–100.0)
Platelets: 198 10*3/uL (ref 150–400)
RBC: 4.07 MIL/uL (ref 3.87–5.11)
RDW: 14.2 % (ref 11.5–15.5)
WBC: 5.4 10*3/uL (ref 4.0–10.5)

## 2016-08-17 LAB — ABO/RH: ABO/RH(D): B POS

## 2016-08-17 LAB — RPR: RPR Ser Ql: NONREACTIVE

## 2016-08-17 MED ORDER — LIDOCAINE HCL (PF) 1 % IJ SOLN
30.0000 mL | INTRAMUSCULAR | Status: AC | PRN
  Administered 2016-08-17: 30 mL via SUBCUTANEOUS
  Filled 2016-08-17: qty 30

## 2016-08-17 MED ORDER — OXYTOCIN 10 UNIT/ML IJ SOLN: 10.0000 [IU] | Freq: Once | INTRAMUSCULAR | Status: DC

## 2016-08-17 MED ORDER — DIPHENHYDRAMINE HCL 25 MG PO CAPS: 25.0000 mg | ORAL_CAPSULE | Freq: Four times a day (QID) | ORAL | Status: DC | PRN

## 2016-08-17 MED ORDER — LACTATED RINGERS IV SOLN
INTRAVENOUS | Status: DC
  Administered 2016-08-17: 07:00:00 via INTRAVENOUS

## 2016-08-17 MED ORDER — SIMETHICONE 80 MG PO CHEW: 80.0000 mg | CHEWABLE_TABLET | ORAL | Status: DC | PRN

## 2016-08-17 MED ORDER — PRENATAL MULTIVITAMIN CH
1.0000 | ORAL_TABLET | Freq: Every day | ORAL | Status: DC
  Administered 2016-08-17 – 2016-08-18 (×2): 1 via ORAL
  Filled 2016-08-17 (×2): qty 1

## 2016-08-17 MED ORDER — ACETAMINOPHEN 325 MG PO TABS: 650.0000 mg | ORAL_TABLET | ORAL | Status: DC | PRN

## 2016-08-17 MED ORDER — OXYCODONE-ACETAMINOPHEN 5-325 MG PO TABS: 2.0000 | ORAL_TABLET | ORAL | Status: DC | PRN

## 2016-08-17 MED ORDER — SENNOSIDES-DOCUSATE SODIUM 8.6-50 MG PO TABS
2.0000 | ORAL_TABLET | ORAL | Status: DC
  Administered 2016-08-17 – 2016-08-19 (×2): 2 via ORAL
  Filled 2016-08-17 (×2): qty 2

## 2016-08-17 MED ORDER — TETANUS-DIPHTH-ACELL PERTUSSIS 5-2.5-18.5 LF-MCG/0.5 IM SUSP: 0.5000 mL | Freq: Once | INTRAMUSCULAR | Status: DC

## 2016-08-17 MED ORDER — OXYCODONE-ACETAMINOPHEN 5-325 MG PO TABS: 1.0000 | ORAL_TABLET | ORAL | Status: DC | PRN

## 2016-08-17 MED ORDER — ONDANSETRON HCL 4 MG PO TABS: 4.0000 mg | ORAL_TABLET | ORAL | Status: DC | PRN

## 2016-08-17 MED ORDER — OXYTOCIN BOLUS FROM INFUSION
500.0000 mL | Freq: Once | INTRAVENOUS | Status: AC
  Administered 2016-08-17: 500 mL via INTRAVENOUS

## 2016-08-17 MED ORDER — DIBUCAINE 1 % RE OINT: 1.0000 "application " | TOPICAL_OINTMENT | RECTAL | Status: DC | PRN

## 2016-08-17 MED ORDER — ONDANSETRON HCL 4 MG/2ML IJ SOLN: 4.0000 mg | INTRAMUSCULAR | Status: DC | PRN

## 2016-08-17 MED ORDER — FLEET ENEMA 7-19 GM/118ML RE ENEM: 1.0000 | ENEMA | Freq: Every day | RECTAL | Status: DC | PRN

## 2016-08-17 MED ORDER — BENZOCAINE-MENTHOL 20-0.5 % EX AERO
1.0000 "application " | INHALATION_SPRAY | CUTANEOUS | Status: DC | PRN
  Administered 2016-08-17: 1 via TOPICAL
  Filled 2016-08-17: qty 56

## 2016-08-17 MED ORDER — OXYCODONE HCL 5 MG PO TABS: 10.0000 mg | ORAL_TABLET | ORAL | Status: DC | PRN

## 2016-08-17 MED ORDER — IBUPROFEN 600 MG PO TABS
600.0000 mg | ORAL_TABLET | Freq: Four times a day (QID) | ORAL | Status: DC
  Administered 2016-08-17 – 2016-08-19 (×8): 600 mg via ORAL
  Filled 2016-08-17 (×8): qty 1

## 2016-08-17 MED ORDER — LACTATED RINGERS IV SOLN: 500.0000 mL | INTRAVENOUS | Status: DC | PRN

## 2016-08-17 MED ORDER — WITCH HAZEL-GLYCERIN EX PADS: 1.0000 "application " | MEDICATED_PAD | CUTANEOUS | Status: DC | PRN

## 2016-08-17 MED ORDER — FENTANYL CITRATE (PF) 100 MCG/2ML IJ SOLN
100.0000 ug | INTRAMUSCULAR | Status: DC | PRN
  Administered 2016-08-17: 100 ug via INTRAVENOUS
  Filled 2016-08-17: qty 2

## 2016-08-17 MED ORDER — COCONUT OIL OIL: 1.0000 "application " | TOPICAL_OIL | Status: DC | PRN

## 2016-08-17 MED ORDER — SOD CITRATE-CITRIC ACID 500-334 MG/5ML PO SOLN: 30.0000 mL | ORAL | Status: DC | PRN

## 2016-08-17 MED ORDER — OXYCODONE HCL 5 MG PO TABS: 5.0000 mg | ORAL_TABLET | ORAL | Status: DC | PRN

## 2016-08-17 MED ORDER — MEASLES, MUMPS & RUBELLA VAC ~~LOC~~ INJ
0.5000 mL | INJECTION | Freq: Once | SUBCUTANEOUS | Status: DC
  Filled 2016-08-17: qty 0.5

## 2016-08-17 MED ORDER — ZOLPIDEM TARTRATE 5 MG PO TABS: 5.0000 mg | ORAL_TABLET | Freq: Every evening | ORAL | Status: DC | PRN

## 2016-08-17 MED ORDER — ONDANSETRON HCL 4 MG/2ML IJ SOLN: 4.0000 mg | Freq: Four times a day (QID) | INTRAMUSCULAR | Status: DC | PRN

## 2016-08-17 MED ORDER — TERBUTALINE SULFATE 1 MG/ML IJ SOLN
0.2500 mg | Freq: Once | INTRAMUSCULAR | Status: DC | PRN
  Filled 2016-08-17: qty 1

## 2016-08-17 MED ORDER — OXYTOCIN 40 UNITS IN LACTATED RINGERS INFUSION - SIMPLE MED
2.5000 [IU]/h | INTRAVENOUS | Status: DC
  Filled 2016-08-17: qty 1000

## 2016-08-17 MED ORDER — METHYLERGONOVINE MALEATE 0.2 MG PO TABS: 0.2000 mg | ORAL_TABLET | ORAL | Status: DC | PRN

## 2016-08-17 MED ORDER — MISOPROSTOL 25 MCG QUARTER TABLET
25.0000 ug | ORAL_TABLET | ORAL | Status: DC | PRN
Start: 2016-08-17 — End: 2016-08-17
  Filled 2016-08-17: qty 1

## 2016-08-17 MED ORDER — METHYLERGONOVINE MALEATE 0.2 MG/ML IJ SOLN: 0.2000 mg | INTRAMUSCULAR | Status: DC | PRN

## 2016-08-17 NOTE — Progress Notes (Signed)
UR chart review completed.  

## 2016-08-17 NOTE — Lactation Note (Signed)
This note was copied from a baby's chart. Lactation Consultation Note  Patient Name: Colleen Graham ZOXWR'UToday's Date: 08/17/2016 Reason for consult: Initial assessment Baby at 8 hr of life. Mom reports baby latched well both times she has offered. She bf her older child until she went back to work and had a hard time keeping up with pumping. She reports in the first 3 days her older child was fussy and hungry so she supplemented. She is not sure that she ever made enough milk. During this visit her sister was holding the baby and he began to cry. Mom handed her sister a pacifier. Offered to help latch baby and mom declined. She stated baby just ate. Discussed baby behavior, feeding frequency, artificial nipples, supplementing, baby belly size, voids, wt loss, breast changes, and nipple care. Mom stated she can manually express. She does not have the "Baby and Me" book, pencil, or spoon in the room. RN to bring supplies. Given lactation handouts. Aware of OP services and support group. Mom will call at the next feeding for lactation to observe a latch.     Maternal Data Has patient been taught Hand Expression?: Yes Does the patient have breastfeeding experience prior to this delivery?: Yes  Feeding Feeding Type: Breast Fed Length of feed: 10 min  LATCH Score/Interventions                      Lactation Tools Discussed/Used WIC Program: No (plans to sign up)   Consult Status Consult Status: Follow-up Date: 08/18/16 Follow-up type: In-patient    Rulon Eisenmengerlizabeth E Brenten Janney 08/17/2016, 4:05 PM

## 2016-08-17 NOTE — MAU Note (Signed)
Contractions since 4am.  Denies vag bleeding or LOF. +FM 1cm on last exam;

## 2016-08-17 NOTE — H&P (Signed)
LABOR AND DELIVERY ADMISSION HISTORY AND PHYSICAL NOTE  Colleen Graham is a 25 y.o. female G2P1 with IUP at 4167w0d by  LMP presenting for SOL.   She reports positive fetal movement. She denies leakage of fluid or vaginal bleeding.  Prenatal History/Complications:  Past Medical History: History reviewed. No pertinent past medical history.  Past Surgical History: History reviewed. No pertinent surgical history.  Obstetrical History: OB History    Gravida Para Term Preterm AB Living   2 1       1    SAB TAB Ectopic Multiple Live Births           1      Social History: Social History   Social History  . Marital status: Single    Spouse name: N/A  . Number of children: N/A  . Years of education: N/A   Social History Main Topics  . Smoking status: Never Smoker  . Smokeless tobacco: Never Used  . Alcohol use No  . Drug use: No  . Sexual activity: Not Asked   Other Topics Concern  . None   Social History Narrative  . None    Family History: Family History  Problem Relation Age of Onset  . Diabetes Maternal Grandfather     Allergies: No Known Allergies  Prescriptions Prior to Admission  Medication Sig Dispense Refill Last Dose  . Prenatal Vit-Fe Phos-FA-Omega (VITAFOL GUMMIES) 3.33-0.333-34.8 MG CHEW Chew 3 tablets by mouth daily. 90 tablet 12 Taking     Review of Systems   All systems reviewed and negative except as stated in HPI  Blood pressure 116/68, pulse 89, temperature 97.3 F (36.3 C), temperature source Oral, resp. rate 18, height 5\' 8"  (1.727 m), weight 238 lb (108 kg), last menstrual period 11/04/2015, SpO2 100 %. General appearance: alert, cooperative and appears stated age Lungs: clear to auscultation bilaterally Heart: regular rate and rhythm Abdomen: soft, non-tender; bowel sounds normal Extremities: No calf swelling or tenderness Presentation: cephalic Fetal monitoring: Cat I tracing Uterine activity: q-403min Dilation: 6 Effacement (%):  80 Station: -2 Exam by:: Steward DroneVeronica Rogers RN   Prenatal labs: ABO, Rh: B/Positive/-- (05/18 1649) Antibody: Negative (05/18 1649) Rubella: immune RPR: Non Reactive (09/07 1124)  HBsAg: Negative (05/18 1649)  HIV: Non Reactive (09/07 1124)  GBS: Negative (10/30 0000)  1 hr Glucola: 3rd trimester 78/116/101 Genetic screening:  normal Anatomy US: normal  Prenatal Transfer Tool  Maternal Diabetes: No Genetic Screening: Normal Maternal Ultrasounds/Referrals: Normal Fetal Ultrasounds or other Referrals:  None Maternal Substance Abuse:  No Significant Maternal Medications:  None Significant Maternal Lab Results: None  Results for orders placed or performed during the hospital encounter of 08/17/16 (from the past 24 hour(s))  CBC   Collection Time: 08/17/16  6:43 AM  Result Value Ref Range   WBC 5.4 4.0 - 10.5 K/uL   RBC 4.07 3.87 - 5.11 MIL/uL   Hemoglobin 10.6 (L) 12.0 - 15.0 g/dL   HCT 16.131.3 (L) 09.636.0 - 04.546.0 %   MCV 76.9 (L) 78.0 - 100.0 fL   MCH 26.0 26.0 - 34.0 pg   MCHC 33.9 30.0 - 36.0 g/dL   RDW 40.914.2 81.111.5 - 91.415.5 %   Platelets 198 150 - 400 K/uL    Patient Active Problem List   Diagnosis Date Noted  . Encounter for trial of labor 08/17/2016  . Supervision of low-risk pregnancy 01/26/2016    Assessment: Colleen Fushkelon Q Stearns is a 25 y.o. G2P1 at 5067w0d here for SOL  #  Labor:Anticipate SVD. #Pain: IV Pain meds prn #FWB: Cat I tracing #ID:  GBS negative #MOF: Breast #MOC:IUD #Circ:  Outpatient  WALLACE, NOAH I, DO PGY-3 08/17/2016, 7:42 AM  OB FELLOW HISTORY AND PHYSICAL ATTESTATION  I have seen and examined this patient; I agree with above documentation in the resident's note.    Ernestina Pennaicholas Vishruth Seoane 08/17/2016, 9:22 AM

## 2016-08-17 NOTE — Progress Notes (Addendum)
Assumed care of pt after receiving report from previous shift nurse. Pt w/o complaints. Holding infant with other siblings at bs. FOB at bs for support. 0/10 pain.   2345: infant assessment done. Noted jittery/tremor like motion of both hands and feet. Nursery nurses notified and made aware. Nurse stated due to immature nervous system and nothing to worry about. Reassured pt and FOB that nursery nurse states normal process.

## 2016-08-18 ENCOUNTER — Encounter (HOSPITAL_COMMUNITY): Payer: Self-pay

## 2016-08-18 ENCOUNTER — Inpatient Hospital Stay (HOSPITAL_COMMUNITY): Admission: RE | Admit: 2016-08-18 | Payer: Medicaid Other | Source: Ambulatory Visit

## 2016-08-18 NOTE — Progress Notes (Signed)
Post Partum Day  1 Subjective: Patient doing well. Reports appropriate lochia, minimal pain. +BM. Urinating and ambulating without difficulty. Breastfeeding.  Objective: Blood pressure 129/65, pulse 90, temperature 98.7 F (37.1 C), temperature source Oral, resp. rate 16, height 5\' 8"  (1.727 m), weight 238 lb (108 kg), last menstrual period 11/04/2015, SpO2 100 %, unknown if currently breastfeeding.  Physical Exam:  General: alert, cooperative and no distress Lochia: appropriate Uterine Fundus: firm DVT Evaluation: No evidence of DVT seen on physical exam.   Recent Labs  08/17/16 0643  HGB 10.6*  HCT 31.3*    Assessment/Plan: Plan for discharge tomorrow   LOS: 1 day   Clearance Cootsndrew Tyson 08/18/2016, 2:33 PM    OB FELLOW POSTPARTUM PROGRESS NOTE ATTESTATION  I have seen and examined this patient and agree with above documentation in the resident's note.   Jen MowElizabeth Mumaw, DO OB Fellow

## 2016-08-18 NOTE — Lactation Note (Signed)
This note was copied from a baby's chart. Lactation Consultation Note Follow up visit at 38 hours of age.  Mom reports having sore nipples,  Baby is latched now.  Baby noted in cradle hold to have shallow latch with non nutritive intermittent sucks.   Mom instructed to unlatch baby and attempt re-latch in cross cradle hold.   Baby is noted to be very jittery as reported by RN with normal glucose with last test.  Baby is alert with eyes open and vigorous/frantic rooting to latch.  Baby latched with few strong sucks and then few intermittent needing stimulation to maintain feeding and then is sleepy.   LC instructed mom on how to watch jaw movement to assess for swallowing.  Only non nutritive sucking noted.  Baby has quivering jaw with sucking possibly due to jitteryness.  After about 10 more minutes baby asleep at the breast.   LC set up DEBP to increase stimulation at the breast and try to offer supplement.  LC assisted with DEBP set up with #27 flange on left breast and #24 on right.  Mom denies pain with pumping.   Mom encouraged to post pump 8x/daily.     LC reported to RN if baby losses additional 4% weight loss to supplement with breastmilk and formula as needed.  LC discussed using formula as needed and mom is agreeable.  Baby has had 3 voids and 3 stools over the past 24 hours with 10 feedings. LC impression is that baby has been getting a shallow latch and transferring some at the breast.     LC discussed cluster feeding and keeping baby active during feedings.  LC reported to Rn and Rn reported to baby's provider.    Patient Name: Colleen Graham Reason for consult: Follow-up assessment   Maternal Data Has patient been taught Hand Expression?: Yes  Feeding Feeding Type: Breast Fed Length of feed:  (6 minutes prior to visit and then additional 10 minutes on and off)  LATCH Score/Interventions Latch: Repeated attempts needed to sustain latch, nipple held in  mouth throughout feeding, stimulation needed to elicit sucking reflex. Intervention(s): Adjust position;Assist with latch;Breast massage;Breast compression  Audible Swallowing: None Intervention(s): Skin to skin;Hand expression  Type of Nipple: Everted at rest and after stimulation  Comfort (Breast/Nipple): Soft / non-tender     Hold (Positioning): Assistance needed to correctly position infant at breast and maintain latch. Intervention(s): Breastfeeding basics reviewed;Support Pillows;Position options;Skin to skin  LATCH Score: 6  Lactation Tools Discussed/Used Pump Review: Setup, frequency, and cleaning;Milk Storage Initiated by:: JS Date initiated:: 08/18/16   Consult Status Consult Status: Follow-up Date: 08/19/16 Follow-up type: In-patient    Colleen Graham, Colleen Graham Graham, 10:16 PM

## 2016-08-19 MED ORDER — ACETAMINOPHEN 325 MG PO TABS: 650.0000 mg | ORAL_TABLET | ORAL | 0 refills | Status: AC | PRN

## 2016-08-19 MED ORDER — IBUPROFEN 600 MG PO TABS: 600.0000 mg | ORAL_TABLET | Freq: Four times a day (QID) | ORAL | 0 refills | Status: AC

## 2016-08-19 NOTE — Discharge Summary (Signed)
OB Discharge Summary     Patient Name: Colleen Graham DOB: December 20, 1990 MRN: [id number]  Date of admission: 08/17/2016 Delivering MD: Deforest HoylesWALLACE, NOAH I   Date of discharge: 08/19/2016  Admitting diagnosis: 40.6 weeks in labor Intrauterine pregnancy: [id number]     Secondary diagnosis:  Active Problems:   Encounter for trial of labor  Additional problems: none     Discharge diagnosis: Term Pregnancy Delivered                                                                                                Post partum procedures:none  Augmentation: AROM  Complications: None  Hospital course:  Onset of Labor With Vaginal Delivery     25 y.o. yo G2P1002 at 737w0d was admitted in Latent Labor on 08/17/2016. Patient had an uncomplicated labor course as follows:  Membrane Rupture Time/Date: 7:50 AM ,08/17/2016   Intrapartum Procedures: Episiotomy: None [1]                                         Lacerations:  1st degree [2]  Patient had a delivery of a Viable infant. 08/17/2016  Information for the patient's newborn:  Colleen Graham, Boy Colleen Graham [[id number]][[id number]]  Delivery Method: Vaginal, Spontaneous Delivery (Filed from Delivery Summary)    Pateint had an uncomplicated postpartum course.  She is ambulating, tolerating a regular diet, passing flatus, and urinating well. Patient is discharged home in stable condition on 08/19/16.    Physical exam Vitals:   08/17/16 1450 08/18/16 0657 08/18/16 1857 08/19/16 0603  BP: 120/61 129/65 115/71 (!) 92/56  Pulse: 75 90 (!) 105 89  Resp: 20 16 17 18   Temp: 98.5 F (36.9 C) 98.7 F (37.1 C) 98.3 F (36.8 C) 98.5 F (36.9 C)  TempSrc: Oral Oral Oral Oral  SpO2:      Weight:      Height:       General: alert, cooperative and no distress Lochia: appropriate Uterine Fundus: firm Incision: N/A DVT Evaluation: No evidence of DVT seen on physical exam. Negative Homan's sign. No cords or calf tenderness. No significant calf/ankle edema. Labs: Lab  Results  Component Value Date   WBC 5.4 08/17/2016   HGB 10.6 (L) 08/17/2016   HCT 31.3 (L) 08/17/2016   MCV 76.9 (L) 08/17/2016   PLT 198 08/17/2016   CMP Latest Ref Rng & Units 08/17/2016  Glucose 65 - 99 mg/dL 865(H135(H)  BUN 6 - 20 mg/dL 7  Creatinine 8.460.44 - 9.621.00 mg/dL 9.520.64  Sodium 841135 - 324145 mmol/L 134(L)  Potassium 3.5 - 5.1 mmol/L 3.8  Chloride 101 - 111 mmol/L 104  CO2 22 - 32 mmol/L 19(L)  Calcium 8.9 - 10.3 mg/dL 9.1  Total Protein 6.5 - 8.1 g/dL 5.9(L)  Total Bilirubin 0.3 - 1.2 mg/dL 4.0(N0.2(L)  Alkaline Phos 38 - 126 U/L 220(H)  AST 15 - 41 U/L 19  ALT 14 - 54 U/L 10(L)    Discharge instruction: per After Visit Summary and "Baby and Me  Booklet".  After visit meds:    Medication List    TAKE these medications   acetaminophen 325 MG tablet Commonly known as:  TYLENOL Take 2 tablets (650 mg total) by mouth every 4 (four) hours as needed (for pain scale < 4).   ibuprofen 600 MG tablet Commonly known as:  ADVIL,MOTRIN Take 1 tablet (600 mg total) by mouth every 6 (six) hours.       Diet: routine diet  Activity: Advance as tolerated. Pelvic rest for 6 weeks.   Outpatient follow up:6 weeks Follow up Appt:Future Appointments Date Time Provider Department Center  09/08/2016 2:45 PM Roe Coombsachelle A Denney, CNM [facility] None   Follow up Visit:No Follow-up on file.  Postpartum contraception: IUD Paragard  Newborn Data: Live born female  Birth Weight: 8 lb 13.6 oz (4014 g) APGAR: 8, 9  Baby Feeding: Breast Disposition:home with mother   08/19/2016 Deforest HoylesWALLACE, NOAH I, DO PGY-3  Patient was seen and examined by me also Agree with note Vitals stable Labs stable Fundus firm, lochia within normal limits Perineum healing Ext WNL  Plans IUD, discussed advantages of both types, will decide Ready for discharge  Colleen Graham, CNM

## 2016-08-19 NOTE — Discharge Instructions (Signed)

## 2016-09-08 ENCOUNTER — Ambulatory Visit: Payer: Medicaid Other | Admitting: Certified Nurse Midwife

## 2019-08-18 ENCOUNTER — Other Ambulatory Visit: Payer: Self-pay

## 2019-08-18 DIAGNOSIS — Z20822 Contact with and (suspected) exposure to covid-19: Secondary | ICD-10-CM

## 2019-08-19 LAB — NOVEL CORONAVIRUS, NAA: SARS-CoV-2, NAA: NOT DETECTED

## 2020-09-21 ENCOUNTER — Other Ambulatory Visit: Payer: Self-pay
